# Patient Record
Sex: Female | Born: 1990 | Race: Black or African American | Hispanic: No | Marital: Single | Smoking: Never smoker
Health system: Southern US, Community
[De-identification: ages and names within clinical notes are randomized; demographics above are authoritative.]

## PROBLEM LIST (undated history)

## (undated) HISTORY — PX: TONSILLECTOMY: SUR1361

---

## 2011-11-20 ENCOUNTER — Other Ambulatory Visit (HOSPITAL_COMMUNITY)
Admission: RE | Admit: 2011-11-20 | Discharge: 2011-11-20 | Disposition: A | Discharge status: Home / Self Care | Payer: PRIVATE HEALTH INSURANCE | Source: Ambulatory Visit | Attending: Obstetrics and Gynecology | Admitting: Obstetrics and Gynecology

## 2011-11-20 DIAGNOSIS — Z113 Encounter for screening for infections with a predominantly sexual mode of transmission: Secondary | ICD-10-CM | POA: Insufficient documentation

## 2011-11-20 DIAGNOSIS — Z124 Encounter for screening for malignant neoplasm of cervix: Secondary | ICD-10-CM | POA: Insufficient documentation

## 2012-04-01 LAB — OB RESULTS CONSOLE GC/CHLAMYDIA
Chlamydia: NEGATIVE
Gonorrhea: NEGATIVE

## 2012-04-01 LAB — OB RESULTS CONSOLE RPR: RPR: NONREACTIVE

## 2012-04-01 LAB — OB RESULTS CONSOLE HEPATITIS B SURFACE ANTIGEN: Hepatitis B Surface Ag: NEGATIVE

## 2012-04-01 LAB — OB RESULTS CONSOLE ANTIBODY SCREEN: Antibody Screen: NEGATIVE

## 2012-07-07 ENCOUNTER — Inpatient Hospital Stay (HOSPITAL_COMMUNITY)
Admission: AD | Admit: 2012-07-07 | Discharge: 2012-07-09 | DRG: 775 | Disposition: A | Discharge status: Home / Self Care | Payer: Medicaid Other | Source: Ambulatory Visit | Attending: Obstetrics & Gynecology | Admitting: Obstetrics & Gynecology

## 2012-07-07 ENCOUNTER — Encounter (HOSPITAL_COMMUNITY): Payer: Self-pay | Admitting: *Deleted

## 2012-07-07 DIAGNOSIS — IMO0001 Reserved for inherently not codable concepts without codable children: Secondary | ICD-10-CM

## 2012-07-07 NOTE — MAU Note (Signed)
Pt G1 at 40.2wks having contractions every and leaking clear fluid x .  Denies problems in pregnancy.  GBS neg.

## 2012-07-08 ENCOUNTER — Encounter (HOSPITAL_COMMUNITY): Payer: Self-pay

## 2012-07-08 ENCOUNTER — Encounter (HOSPITAL_COMMUNITY): Payer: Self-pay | Admitting: Anesthesiology

## 2012-07-08 ENCOUNTER — Encounter (HOSPITAL_COMMUNITY): Payer: Self-pay | Admitting: *Deleted

## 2012-07-08 ENCOUNTER — Inpatient Hospital Stay (HOSPITAL_COMMUNITY): Payer: Medicaid Other | Admitting: Anesthesiology

## 2012-07-08 DIAGNOSIS — IMO0001 Reserved for inherently not codable concepts without codable children: Secondary | ICD-10-CM

## 2012-07-08 LAB — OB RESULTS CONSOLE GBS: GBS: NEGATIVE

## 2012-07-08 LAB — CBC
HCT: 34.9 % — ABNORMAL LOW (ref 36.0–46.0)
Hemoglobin: 11.3 g/dL — ABNORMAL LOW (ref 12.0–15.0)
MCH: 28.4 pg (ref 26.0–34.0)
MCHC: 32.4 g/dL (ref 30.0–36.0)
MCV: 87.7 fL (ref 78.0–100.0)

## 2012-07-08 MED ORDER — PHENYLEPHRINE 40 MCG/ML (10ML) SYRINGE FOR IV PUSH (FOR BLOOD PRESSURE SUPPORT): 80.0000 ug | PREFILLED_SYRINGE | INTRAVENOUS | Status: DC | PRN

## 2012-07-08 MED ORDER — IBUPROFEN 600 MG PO TABS
600.0000 mg | ORAL_TABLET | Freq: Four times a day (QID) | ORAL | Status: DC
  Administered 2012-07-08 – 2012-07-09 (×3): 600 mg via ORAL
  Filled 2012-07-08 (×3): qty 1

## 2012-07-08 MED ORDER — LACTATED RINGERS IV SOLN: 500.0000 mL | Freq: Once | INTRAVENOUS | Status: DC

## 2012-07-08 MED ORDER — IBUPROFEN 600 MG PO TABS
600.0000 mg | ORAL_TABLET | Freq: Four times a day (QID) | ORAL | Status: DC | PRN
  Administered 2012-07-08: 600 mg via ORAL
  Filled 2012-07-08: qty 1

## 2012-07-08 MED ORDER — LACTATED RINGERS IV SOLN
500.0000 mL | INTRAVENOUS | Status: DC | PRN
  Administered 2012-07-08: 300 mL via INTRAVENOUS

## 2012-07-08 MED ORDER — LIDOCAINE HCL (PF) 1 % IJ SOLN
30.0000 mL | INTRAMUSCULAR | Status: DC | PRN
  Filled 2012-07-08: qty 30

## 2012-07-08 MED ORDER — FLEET ENEMA 7-19 GM/118ML RE ENEM: 1.0000 | ENEMA | RECTAL | Status: DC | PRN

## 2012-07-08 MED ORDER — ONDANSETRON HCL 4 MG/2ML IJ SOLN: 4.0000 mg | Freq: Four times a day (QID) | INTRAMUSCULAR | Status: DC | PRN

## 2012-07-08 MED ORDER — EPHEDRINE 5 MG/ML INJ: 10.0000 mg | INTRAVENOUS | Status: DC | PRN

## 2012-07-08 MED ORDER — DIPHENHYDRAMINE HCL 25 MG PO CAPS: 25.0000 mg | ORAL_CAPSULE | Freq: Four times a day (QID) | ORAL | Status: DC | PRN

## 2012-07-08 MED ORDER — ONDANSETRON HCL 4 MG/2ML IJ SOLN: 4.0000 mg | INTRAMUSCULAR | Status: DC | PRN

## 2012-07-08 MED ORDER — TETANUS-DIPHTH-ACELL PERTUSSIS 5-2.5-18.5 LF-MCG/0.5 IM SUSP
0.5000 mL | Freq: Once | INTRAMUSCULAR | Status: AC
  Administered 2012-07-09: 0.5 mL via INTRAMUSCULAR
  Filled 2012-07-08: qty 0.5

## 2012-07-08 MED ORDER — OXYCODONE-ACETAMINOPHEN 5-325 MG PO TABS: 1.0000 | ORAL_TABLET | ORAL | Status: DC | PRN

## 2012-07-08 MED ORDER — OXYTOCIN 40 UNITS IN LACTATED RINGERS INFUSION - SIMPLE MED
62.5000 mL/h | Freq: Once | INTRAVENOUS | Status: DC
  Filled 2012-07-08: qty 1000

## 2012-07-08 MED ORDER — SIMETHICONE 80 MG PO CHEW: 80.0000 mg | CHEWABLE_TABLET | ORAL | Status: DC | PRN

## 2012-07-08 MED ORDER — WITCH HAZEL-GLYCERIN EX PADS: 1.0000 "application " | MEDICATED_PAD | CUTANEOUS | Status: DC | PRN

## 2012-07-08 MED ORDER — PHENYLEPHRINE 40 MCG/ML (10ML) SYRINGE FOR IV PUSH (FOR BLOOD PRESSURE SUPPORT)
80.0000 ug | PREFILLED_SYRINGE | INTRAVENOUS | Status: DC | PRN
  Filled 2012-07-08: qty 5

## 2012-07-08 MED ORDER — DIPHENHYDRAMINE HCL 50 MG/ML IJ SOLN: 12.5000 mg | INTRAMUSCULAR | Status: DC | PRN

## 2012-07-08 MED ORDER — ONDANSETRON HCL 4 MG PO TABS: 4.0000 mg | ORAL_TABLET | ORAL | Status: DC | PRN

## 2012-07-08 MED ORDER — CITRIC ACID-SODIUM CITRATE 334-500 MG/5ML PO SOLN: 30.0000 mL | ORAL | Status: DC | PRN

## 2012-07-08 MED ORDER — LIDOCAINE HCL (PF) 1 % IJ SOLN
INTRAMUSCULAR | Status: DC | PRN
  Administered 2012-07-08: 5 mL
  Administered 2012-07-08: 4 mL

## 2012-07-08 MED ORDER — LANOLIN HYDROUS EX OINT: TOPICAL_OINTMENT | CUTANEOUS | Status: DC | PRN

## 2012-07-08 MED ORDER — ZOLPIDEM TARTRATE 5 MG PO TABS: 5.0000 mg | ORAL_TABLET | Freq: Every evening | ORAL | Status: DC | PRN

## 2012-07-08 MED ORDER — FENTANYL 2.5 MCG/ML BUPIVACAINE 1/10 % EPIDURAL INFUSION (WH - ANES)
INTRAMUSCULAR | Status: DC | PRN
  Administered 2012-07-08: 15 mL/h via EPIDURAL

## 2012-07-08 MED ORDER — LACTATED RINGERS IV SOLN: INTRAVENOUS | Status: DC

## 2012-07-08 MED ORDER — ACETAMINOPHEN 325 MG PO TABS: 650.0000 mg | ORAL_TABLET | ORAL | Status: DC | PRN

## 2012-07-08 MED ORDER — BENZOCAINE-MENTHOL 20-0.5 % EX AERO: 1.0000 "application " | INHALATION_SPRAY | CUTANEOUS | Status: DC | PRN

## 2012-07-08 MED ORDER — EPHEDRINE 5 MG/ML INJ
10.0000 mg | INTRAVENOUS | Status: DC | PRN
  Filled 2012-07-08: qty 4

## 2012-07-08 MED ORDER — SENNOSIDES-DOCUSATE SODIUM 8.6-50 MG PO TABS
2.0000 | ORAL_TABLET | Freq: Every day | ORAL | Status: DC
  Administered 2012-07-08: 2 via ORAL

## 2012-07-08 MED ORDER — OXYTOCIN BOLUS FROM INFUSION
250.0000 mL | Freq: Once | INTRAVENOUS | Status: AC
  Administered 2012-07-08: 250 mL via INTRAVENOUS
  Filled 2012-07-08: qty 500

## 2012-07-08 MED ORDER — FENTANYL 2.5 MCG/ML BUPIVACAINE 1/10 % EPIDURAL INFUSION (WH - ANES)
14.0000 mL/h | INTRAMUSCULAR | Status: DC
  Administered 2012-07-08: 14 mL/h via EPIDURAL
  Filled 2012-07-08 (×2): qty 60

## 2012-07-08 MED ORDER — BUTORPHANOL TARTRATE 1 MG/ML IJ SOLN: 1.0000 mg | INTRAMUSCULAR | Status: DC | PRN

## 2012-07-08 MED ORDER — PRENATAL MULTIVITAMIN CH
1.0000 | ORAL_TABLET | Freq: Every day | ORAL | Status: DC
  Administered 2012-07-09: 1 via ORAL
  Filled 2012-07-08: qty 1

## 2012-07-08 MED ORDER — DIBUCAINE 1 % RE OINT: 1.0000 "application " | TOPICAL_OINTMENT | RECTAL | Status: DC | PRN

## 2012-07-08 NOTE — H&P (Signed)
Latoya Bowen is a 21 y.o. female presenting for c/o rom at 11 pm on 07/07/2012.  She is 40 wks and 3 days with EDD 07/05/2012.  Maternal Medical History:  Reason for admission: Reason for admission: rupture of membranes.  Contractions: Onset was 6-12 hours ago.   Frequency: regular.   Duration is approximately 7 minutes.   Perceived severity is moderate.    Fetal activity: Perceived fetal activity is normal.   Last perceived fetal movement was within the past hour.    Prenatal complications: no prenatal complications   OB History    Grav Para Term Preterm Abortions TAB SAB Ect Mult Living   2    1 1     0     History reviewed. No pertinent past medical history. Past Surgical History  Procedure Date  . Tonsillectomy    Family History: family history is not on file. Social History:  reports that she has never smoked. She does not have any smokeless tobacco history on file. She reports that she does not drink alcohol or use illicit drugs.   Prenatal Transfer Tool  Maternal Diabetes: No Genetic Screening: Normal Maternal Ultrasounds/Referrals: Normal Fetal Ultrasounds or other Referrals:  None Maternal Substance Abuse:  No Significant Maternal Medications:  None Significant Maternal Lab Results:  Lab values include: Group B Strep negative Other Comments:  None  Review of Systems  All other systems reviewed and are negative.    Dilation: 6 Effacement (%): 100 Station: -1 Exam by:: Dr. Gerald Leitz Blood pressure 121/72, pulse 74, temperature 98.2 F (36.8 C), temperature source Oral, resp. rate 20, height 5\' 7"  (1.702 m), weight 84.823 kg (187 lb), SpO2 99.00%. Maternal Exam:  Uterine Assessment: Contraction strength is moderate.  Contraction duration is 1 minute. Contraction frequency is regular.   Abdomen: Patient reports no abdominal tenderness. Estimated fetal weight is 7 lbs 6 oz .   Fetal presentation: vertex  Introitus: Normal vulva. Normal vagina.    Fetal  Exam Fetal Monitor Review: Mode: fetoscope.   Baseline rate: 140.  Variability: moderate (6-25 bpm).   Pattern: accelerations present and no decelerations.    Fetal State Assessment: Category I - tracings are normal.     Physical Exam  Vitals reviewed. Constitutional: She is oriented to person, place, and time. She appears well-developed.  Cardiovascular: Normal rate and regular rhythm.   Respiratory: Effort normal and breath sounds normal.  GI: Bowel sounds are normal.  Genitourinary: Vagina normal.  Musculoskeletal: Normal range of motion.  Neurological: She is alert and oriented to person, place, and time.   Extremities: no edema  Prenatal labs: ABO, Rh: A/Positive/-- (05/23 0000) Antibody: Negative (05/23 0000) Rubella: Equivocal (05/23 0000) RPR: NON REACTIVE (08/29 0100)  HBsAg: Negative (05/23 0000)  HIV: Non-reactive (05/23 0000)  GBS: Negative (08/29 0000)   Assessment/Plan: 40 wks and 3 days in active labor gbs negative..  Anticipate SVD    Latoya Hatchel J. 07/08/2012, 5:56 AM

## 2012-07-08 NOTE — Op Note (Signed)
Delivery Note At 10:27 AM a viable and healthy female was delivered via Vaginal, Spontaneous Delivery (Presentation: ; Occiput Posterior and rotated at plus 8 station to  doa).  APGAR: 7, 9; weight .   Placenta status: Intact, Spontaneous.  Cord: 3 vessels with the following complications: None.  Cord pH: 7.18  Anesthesia: Epidural  Episiotomy: None Lacerations: None Suture Repair: none Est. Blood Loss (mL): 200  Mom to none.  Baby to nursery-stable.  Hollis Tuller H. 07/08/2012, 11:42 AM

## 2012-07-08 NOTE — Anesthesia Postprocedure Evaluation (Signed)
  Anesthesia Post-op Note  Patient: Latoya Bowen  Procedure(s) Performed: * No procedures listed *  Patient Location: Mother/Baby  Anesthesia Type: Epidural  Level of Consciousness: awake  Airway and Oxygen Therapy: Patient Spontanous Breathing  Post-op Pain: none  Post-op Assessment: Patient's Cardiovascular Status Stable, Respiratory Function Stable, Patent Airway, No signs of Nausea or vomiting, Adequate PO intake, Pain level controlled, No headache, No backache, No residual numbness and No residual motor weakness  Post-op Vital Signs: Reviewed and stable  Complications: No apparent anesthesia complications

## 2012-07-08 NOTE — Anesthesia Preprocedure Evaluation (Signed)

## 2012-07-08 NOTE — Anesthesia Procedure Notes (Signed)
Epidural Patient location during procedure: OB Start time: 07/08/2012 3:17 AM  Staffing Anesthesiologist: Willford Rabideau A. Performed by: anesthesiologist   Preanesthetic Checklist Completed: patient identified, site marked, surgical consent, pre-op evaluation, timeout performed, IV checked, risks and benefits discussed and monitors and equipment checked  Epidural Patient position: sitting Prep: site prepped and draped and DuraPrep Patient monitoring: continuous pulse ox and blood pressure Approach: midline Injection technique: LOR air  Needle:  Needle type: Tuohy  Needle gauge: 17 G Needle length: 9 cm and 9 Needle insertion depth: 4 and 4 cm Catheter type: closed end flexible Catheter size: 19 Gauge Catheter at skin depth: 9 cm Test dose: negative and Other  Assessment Events: blood not aspirated, injection not painful, no injection resistance, negative IV test and no paresthesia  Additional Notes Patient identified. Risks and benefits discussed including failed block, incomplete  Pain control, post dural puncture headache, nerve damage, paralysis, blood pressure Changes, nausea, vomiting, reactions to medications-both toxic and allergic and post Partum back pain. All questions were answered. Patient expressed understanding and wished to proceed. Sterile technique was used throughout procedure. Epidural site was Dressed with sterile barrier dressing. No paresthesias, signs of intravascular injection Or signs of intrathecal spread were encountered.  Patient was more comfortable after the epidural was dosed. Please see RN's note for documentation of vital signs and FHR which are stable.

## 2012-07-09 LAB — CBC
MCV: 87.8 fL (ref 78.0–100.0)
Platelets: 195 10*3/uL (ref 150–400)
RDW: 12.3 % (ref 11.5–15.5)
WBC: 8.1 10*3/uL (ref 4.0–10.5)

## 2012-07-09 NOTE — Progress Notes (Signed)
UR chart review completed.  

## 2012-07-09 NOTE — Progress Notes (Signed)
Post Partum Day 2Subjective: no complaints  Objective: Blood pressure 117/65, pulse 71, temperature 97.8 F (36.6 C), temperature source Oral, resp. rate 16, height 5\' 7"  (1.702 m), weight 187 lb (84.823 kg), SpO2 99.00%, unknown if currently breastfeeding.  Physical Exam:  General: alert, cooperative and no distress Lochia: appropriate Uterine Fundus: firm Episiotomy, laceration : no significant drainage DVT Evaluation: No evidence of DVT seen on physical exam.   Basename 07/09/12 0535 07/08/12 0100  HGB 10.4* 11.3*  HCT 31.6* 34.9*    Assessment/Plan: Discharge home   LOS: 2 days   Kao Conry E 07/09/2012, 9:33 AM

## 2012-07-09 NOTE — Discharge Summary (Signed)
Obstetric Discharge Summary Reason for Admission: onset of labor Prenatal Procedures: none Intrapartum Procedures: spontaneous vaginal delivery Postpartum Procedures: none Complications-Operative and Postpartum: none  Hemoglobin  Date Value Range Status  07/09/2012 10.4* 12.0 - 15.0 g/dL Final     HCT  Date Value Range Status  07/09/2012 31.6* 36.0 - 46.0 % Final    Discharge Diagnoses: Term Pregnancy-delivered  Discharge Information: Date: 07/09/2012 Activity: pelvic rest Diet: routine Medications: Ibuprofen Condition: stable Instructions: refer to practice specific booklet Discharge to: home   Newborn Data: Live born  Information for the patient's newborn:  Aarti, Mankowski [[id number]]  female ; APGAR , ; weight ;  Home with mother.  Laraine Samet E 07/09/2012, 9:34 AM

## 2014-09-11 ENCOUNTER — Encounter (HOSPITAL_COMMUNITY): Payer: Self-pay

## 2018-07-04 ENCOUNTER — Encounter (HOSPITAL_BASED_OUTPATIENT_CLINIC_OR_DEPARTMENT_OTHER): Payer: Self-pay | Admitting: Emergency Medicine

## 2018-07-04 ENCOUNTER — Emergency Department (HOSPITAL_BASED_OUTPATIENT_CLINIC_OR_DEPARTMENT_OTHER)
Admission: EM | Admit: 2018-07-04 | Discharge: 2018-07-04 | Disposition: A | Discharge status: Home / Self Care | Payer: BLUE CROSS/BLUE SHIELD | Attending: Emergency Medicine | Admitting: Emergency Medicine

## 2018-07-04 ENCOUNTER — Other Ambulatory Visit: Payer: Self-pay

## 2018-07-04 DIAGNOSIS — N939 Abnormal uterine and vaginal bleeding, unspecified: Secondary | ICD-10-CM | POA: Diagnosis present

## 2018-07-04 LAB — COMPREHENSIVE METABOLIC PANEL
ALK PHOS: 77 U/L (ref 38–126)
ALT: 14 U/L (ref 0–44)
ANION GAP: 9 (ref 5–15)
AST: 20 U/L (ref 15–41)
Albumin: 4.1 g/dL (ref 3.5–5.0)
BUN: 9 mg/dL (ref 6–20)
CALCIUM: 9.2 mg/dL (ref 8.9–10.3)
CO2: 28 mmol/L (ref 22–32)
CREATININE: 1.04 mg/dL — AB (ref 0.44–1.00)
Chloride: 100 mmol/L (ref 98–111)
GFR calc non Af Amer: >60 mL/min (ref >60)
Glucose, Bld: 96 mg/dL (ref 70–99)
Potassium: 3.4 mmol/L — ABNORMAL LOW (ref 3.5–5.1)
Sodium: 137 mmol/L (ref 135–145)
TOTAL PROTEIN: 8.4 g/dL — AB (ref 6.5–8.1)
Total Bilirubin: 0.7 mg/dL (ref 0.3–1.2)

## 2018-07-04 LAB — CBC WITH DIFFERENTIAL/PLATELET
Basophils Absolute: 0 10*3/uL (ref 0.0–0.1)
Basophils Relative: 1 %
EOS ABS: 0 10*3/uL (ref 0.0–0.7)
Eosinophils Relative: 1 %
HEMATOCRIT: 37 % (ref 36.0–46.0)
HEMOGLOBIN: 12.1 g/dL (ref 12.0–15.0)
Lymphocytes Relative: 25 %
Lymphs Abs: 1.4 10*3/uL (ref 0.7–4.0)
MCH: 28.2 pg (ref 26.0–34.0)
MCHC: 32.7 g/dL (ref 30.0–36.0)
MCV: 86.2 fL (ref 78.0–100.0)
MONOS PCT: 8 %
Monocytes Absolute: 0.5 10*3/uL (ref 0.1–1.0)
NEUTROS ABS: 3.7 10*3/uL (ref 1.7–7.7)
NEUTROS PCT: 65 %
Platelets: 391 10*3/uL (ref 150–400)
RBC: 4.29 MIL/uL (ref 3.87–5.11)
RDW: 11.8 % (ref 11.5–15.5)
WBC: 5.6 10*3/uL (ref 4.0–10.5)

## 2018-07-04 LAB — PREGNANCY, URINE: Preg Test, Ur: NEGATIVE

## 2018-07-04 LAB — WET PREP, GENITAL
Sperm: NONE SEEN
TRICH WET PREP: NONE SEEN
Yeast Wet Prep HPF POC: NONE SEEN

## 2018-07-04 MED ORDER — NORGESTIMATE-ETH ESTRADIOL 0.25-35 MG-MCG PO TABS: 1.0000 | ORAL_TABLET | Freq: Every day | ORAL | 0 refills | Status: DC

## 2018-07-04 MED ORDER — NORGESTIMATE-ETH ESTRADIOL 0.25-35 MG-MCG PO TABS: 1.0000 | ORAL_TABLET | Freq: Every day | ORAL | 11 refills | Status: DC

## 2018-07-04 NOTE — ED Triage Notes (Signed)
Vaginal bleeding x 1 week. States she doesn't normally have a period this long. States she is passing clots. She states she took 3 birth control pills at one time just before the bleeding started to try to stop her period for her vacation.

## 2018-07-04 NOTE — ED Provider Notes (Signed)
Emergency Department Provider Note   I have reviewed the triage vital signs and the nursing notes.   HISTORY  Chief Complaint Vaginal Bleeding   HPI Latoya Bowen is a 27 y.o. female without significant past medical history the presents to the emergency department today secondary to vaginal bleeding.  Patient states that last month she took some of her friend's birth control pill to try to stop her.  From coming on because she is going on vacation.  She took one birth control pill on Sunday Monday and Tuesday skipped Wednesday and took one on Thursday but then her period started on Saturday.  That period was normal and lasted about a week which would be her normal.  It was a little bit earlier than she expected.  7 days ago patient started having vaginal bleeding again and is a bit heavier than what she is used to and is significant earlier as well.  This is continued without improvement so she presents here for further evaluation.  She has abdominal cramping but no severe abdominal pain.  On multiple attempts at review of systems it does like she has some pain with intercourse but only during one episode and since then she is been fine.  She has not had any vaginal discharge.  She did have some right shoulder pain in the next day had some right upper quadrant pain.  She has no history of STDs and reports monogamy with her boyfriend with whom she has low suspicion for him being unfaithful. No other associated or modifying symptoms.    History reviewed. No pertinent past medical history.  Patient Active Problem List   Diagnosis Date Noted  . Active labor 07/08/2012    Past Surgical History:  Procedure Laterality Date  . TONSILLECTOMY      Current Outpatient Rx  . Order #: [id number] Class: Historical Med  . Order #: [id number] Class: Print  . Order #: [id number] Class: Print    Allergies Patient has no known allergies.  No family history on file.  Social History Social History    Tobacco Use  . Smoking status: Never Smoker  . Smokeless tobacco: Never Used  Substance Use Topics  . Alcohol use: No  . Drug use: No    Review of Systems  All other systems negative except as documented in the HPI. All pertinent positives and negatives as reviewed in the HPI. ____________________________________________   PHYSICAL EXAM:  VITAL SIGNS: Vitals not charted. Had BP of 108/?, HR of 68.  Constitutional: Alert and oriented. Well appearing and in no acute distress. Eyes: Conjunctivae are normal. PERRL. EOMI. Head: Atraumatic. Nose: No congestion/rhinnorhea. Mouth/Throat: Mucous membranes are moist.  Oropharynx non-erythematous. Neck: No stridor.  No meningeal signs.   Cardiovascular: Normal rate, regular rhythm. Good peripheral circulation. Grossly normal heart sounds.   Respiratory: Normal respiratory effort.  No retractions. Lungs CTAB. Gastrointestinal: Soft and nontender. No distention.  GU: chaperoned by RT, Crystal, blood in vault, oozing from closed cervical os. No CMT. No adnexal fullness or ttp. Musculoskeletal: No lower extremity tenderness nor edema. No gross deformities of extremities. Neurologic:  Normal speech and language. No gross focal neurologic deficits are appreciated.  Skin:  Skin is warm, dry and intact. No rash noted.   ____________________________________________   LABS (all labs ordered are listed, but only abnormal results are displayed)  Labs Reviewed  WET PREP, GENITAL - Abnormal; Notable for the following components:      Result Value   Clue Cells Wet Prep HPF  POC PRESENT (*)    WBC, Wet Prep HPF POC FEW (*)    All other components within normal limits  COMPREHENSIVE METABOLIC PANEL - Abnormal; Notable for the following components:   Potassium 3.4 (*)    Creatinine, Ser 1.04 (*)    Total Protein 8.4 (*)    All other components within normal limits  PREGNANCY, URINE  CBC WITH DIFFERENTIAL/PLATELET  GC/CHLAMYDIA PROBE AMP  ([place]) NOT AT [facility]   ____________________________________________  PROCEDURES  Procedure(s) performed:   Pelvic exam Date/Time: 07/04/2018 2:39 PM Performed by: Marily MemosMesner, Kamilla Hands, MD Authorized by: Marily MemosMesner, Luwanna Brossman, MD  Consent: Verbal consent obtained. Risks and benefits: risks, benefits and alternatives were discussed Consent given by: patient Patient understanding: patient states understanding of the procedure being performed Patient consent: the patient's understanding of the procedure does not match consent given Required items: required blood products, implants, devices, and special equipment available Patient identity confirmed: verbally with patient Time out: Immediately prior to procedure a "time out" was called to verify the correct patient, procedure, equipment, support staff and site/side marked as required. Preparation: Patient was prepped and draped in the usual sterile fashion. Local anesthesia used: no  Anesthesia: Local anesthesia used: no  Sedation: Patient sedated: no  Patient tolerance: Patient tolerated the procedure well with no immediate complications Comments: Chaperoned by RT, Crystal.   ____________________________________________   INITIAL IMPRESSION / ASSESSMENT AND PLAN / ED COURSE  Abnormal uterine bleed likely secondary to introduction of exogenous hormones and abnormal pattern.  No evidence of pregnancy, STD or other abdomen allergies on exam.  No urinary symptoms to suspect urinary tract infection.  Has had normal Pap smears and a normal GU exam making cancer unlikely.  Will institute hormonal therapy and gynecology follow-up.   Pertinent labs & imaging results that were available during my care of the patient were reviewed by me and considered in my medical decision making (see chart for details).  ____________________________________________  FINAL CLINICAL IMPRESSION(S) / ED DIAGNOSES  Final diagnoses:  Vaginal bleeding     MEDICATIONS GIVEN DURING THIS VISIT:  Medications - No data to display   NEW OUTPATIENT MEDICATIONS STARTED DURING THIS VISIT:  Current Discharge Medication List    START taking these medications   Details  !! norgestimate-ethinyl estradiol (SPRINTEC 28) 0.25-35 MG-MCG tablet Take 1 tablet by mouth daily. Qty: 1 Package, Refills: 0    !! norgestimate-ethinyl estradiol (SPRINTEC 28) 0.25-35 MG-MCG tablet Take 1 tablet by mouth daily. Qty: 1 Package, Refills: 11     !! - Potential duplicate medications found. Please discuss with provider.      Note:  This note was prepared with assistance of Dragon voice recognition software. Occasional wrong-word or sound-a-like substitutions may have occurred due to the inherent limitations of voice recognition software.   Marily MemosMesner, Katelyn Kohlmeyer, MD 07/04/18 [phone]

## 2018-07-04 NOTE — ED Notes (Signed)
Pt unable to void at this time. 

## 2018-07-04 NOTE — ED Notes (Signed)
Pt/family verbalized understanding of discharge instructions.   

## 2018-07-04 NOTE — ED Notes (Signed)
Pt asked for this EMT not to come help the Dr with the pelvic due to personally knowing the pt and stating " it will just be too awkward for you to be in here and seeing my parts." EMT understood and got someone else to help with the pelvic.

## 2018-07-04 NOTE — Discharge Instructions (Addendum)
You have received 2 prescriptions for a medication called Sprintec. This is an oral contraceptive. The first prescription is for one package. This one package you should take 3 pills a day for one week then throw the rest away. This should help with your uterine bleeding. It should cease during this time. After this week take one week off and during this time you will likely have a regular period. After the one week off without medication get the other prescription filled which is the same medication. Then start taking it once daily as instructed on the package. This may cause a little bit of nausea when you're taking 3 a day if so you may back down to twice a day. Please follow-up with her gynecologist in 1-2 weeks for further evaluation and management of your vaginal bleeding.

## 2018-07-05 LAB — GC/CHLAMYDIA PROBE AMP (~~LOC~~) NOT AT ARMC
Chlamydia: POSITIVE — AB
NEISSERIA GONORRHEA: NEGATIVE

## 2018-07-08 ENCOUNTER — Telehealth: Payer: Self-pay | Admitting: Student

## 2018-07-08 DIAGNOSIS — A749 Chlamydial infection, unspecified: Secondary | ICD-10-CM

## 2018-07-08 MED ORDER — AZITHROMYCIN 500 MG PO TABS: 1000.0000 mg | ORAL_TABLET | Freq: Once | ORAL | 0 refills | Status: AC

## 2018-07-08 NOTE — Telephone Encounter (Signed)
-----   Message from Kathe BectonLori S [name], RN sent at 07/08/2018  3:09 PM EDT ----- This patient tested positive for:  chlamydia  She has, "NKDA",  I have informed the patient of her results and confirmed her pharmacy is correct in her chart. Please send Rx.   Thank you,   [name], [name], RN   [facility]

## 2018-07-08 NOTE — Telephone Encounter (Addendum)
  Latoya Bowen tested positive for  Chlamydia. Patient was called by RN and allergies and pharmacy confirmed. Rx sent to pharmacy of choice.   [name], [name], NP 07/08/2018 4:40 PM       ----- Message from Kathe BectonLori S [name], RN sent at 07/08/2018  3:09 PM EDT ----- This patient tested positive for:  chlamydia  She has, "NKDA",  I have informed the patient of her results and confirmed her pharmacy is correct in her chart. Please send Rx.   Thank you,   [name], [name], RN   [facility]

## 2018-08-05 ENCOUNTER — Emergency Department (HOSPITAL_BASED_OUTPATIENT_CLINIC_OR_DEPARTMENT_OTHER)
Admission: EM | Admit: 2018-08-05 | Discharge: 2018-08-05 | Disposition: A | Discharge status: Home / Self Care | Payer: BLUE CROSS/BLUE SHIELD | Attending: Emergency Medicine | Admitting: Emergency Medicine

## 2018-08-05 ENCOUNTER — Other Ambulatory Visit: Payer: Self-pay

## 2018-08-05 ENCOUNTER — Encounter (HOSPITAL_BASED_OUTPATIENT_CLINIC_OR_DEPARTMENT_OTHER): Payer: Self-pay | Admitting: Emergency Medicine

## 2018-08-05 DIAGNOSIS — H00015 Hordeolum externum left lower eyelid: Secondary | ICD-10-CM

## 2018-08-05 MED ORDER — POLYMYXIN B-TRIMETHOPRIM 10000-0.1 UNIT/ML-% OP SOLN: 1.0000 [drp] | OPHTHALMIC | 0 refills | Status: AC

## 2018-08-05 NOTE — ED Triage Notes (Signed)
Redness and swelling to lower left eyelid x3 days.  Sts it started draining yesterday.

## 2018-08-05 NOTE — ED Provider Notes (Signed)
[facility] EMERGENCY DEPARTMENT Provider Note   CSN: [id number] Arrival date & time: 08/05/18  4098     History   Chief Complaint Chief Complaint  Patient presents with  . Eye Problem    HPI Latoya Bowen is a 27 y.o. female.  HPI   27 year old female with redness, swelling and discomfort to left lower eyelid.  Onset about 3 days ago.  Persistent since then.  She has been using warm compresses without improvement.  No change in her vision.  No fevers or chills.  No drainage.  History reviewed. No pertinent past medical history.  Patient Active Problem List   Diagnosis Date Noted  . Active labor 07/08/2012    Past Surgical History:  Procedure Laterality Date  . TONSILLECTOMY       OB History    Gravida  2   Para  1   Term  1   Preterm      AB  1   Living  1     SAB      TAB  1   Ectopic      Multiple      Live Births  1            Home Medications    Prior to Admission medications   Medication Sig Start Date End Date Taking? Authorizing Provider  Fluocin-Hydroquinone-Tretinoin (TRI-LUMA) 0.01-4-0.05 % CREA AFFECTED AREA(S) AT BEDTIME ONCE DAILY 01/11/18  Yes [provider]  Multiple Vitamin (MULTIVITAMIN) tablet Take 1 tablet by mouth daily.    [provider]  trimethoprim-polymyxin b (POLYTRIM) ophthalmic solution Place 1 drop into the left eye every 4 (four) hours. 08/05/18   Raeford Razor, MD    Family History No family history on file.  Social History Social History   Tobacco Use  . Smoking status: Never Smoker  . Smokeless tobacco: Never Used  Substance Use Topics  . Alcohol use: No  . Drug use: No     Allergies   Patient has no known allergies.   Review of Systems Review of Systems  All systems reviewed and negative, other than as noted in HPI.  Physical Exam Updated Vital Signs BP 135/89   Pulse 74   Temp 98.3 F (36.8 C) (Oral)   Resp 16   Ht 5\' 8"  (1.727 m)   Wt 77.1 kg    LMP 08/01/2018   SpO2 99%   BMI 25.85 kg/m   Physical Exam  Constitutional: She appears well-developed and well-nourished. No distress.  HENT:  Head: Normocephalic and atraumatic.  Eyes: Right eye exhibits no discharge. Left eye exhibits no discharge.  Hordeolum of left lower eyelid.  Lids, lashes and conjunctiva otherwise normal.  No drainage.  Neck: Neck supple.  Cardiovascular: Normal rate, regular rhythm and normal heart sounds. Exam reveals no gallop and no friction rub.  No murmur heard. Pulmonary/Chest: Effort normal and breath sounds normal. No respiratory distress.  Abdominal: Soft. She exhibits no distension. There is no tenderness.  Musculoskeletal: She exhibits no edema or tenderness.  Neurological: She is alert.  Skin: Skin is warm and dry.  Psychiatric: She has a normal mood and affect. Her behavior is normal. Thought content normal.  Nursing note and vitals reviewed.    ED Treatments / Results  Labs (all labs ordered are listed, but only abnormal results are displayed) Labs Reviewed - No data to display  EKG None  Radiology No results found.  Procedures Procedures (including critical care time)  Medications  Ordered in ED Medications - No data to display   Initial Impression / Assessment and Plan / ED Course  I have reviewed the triage vital signs and the nursing notes.  Pertinent labs & imaging results that were available during my care of the patient were reviewed by me and considered in my medical decision making (see chart for details).     27 year old female with hordeolum of left lower eyelid.  Continue warm compresses. Antibiotic drops.  Advised not to wear any contact lenses until completely healed.  Emergent return precautions were discussed.  Final Clinical Impressions(s) / ED Diagnoses   Final diagnoses:  Hordeolum externum of left lower eyelid    ED Discharge Orders         Ordered    trimethoprim-polymyxin b (POLYTRIM) ophthalmic  solution  Every 4 hours    Note to Pharmacy:  For 5 days   08/05/18 1008           Raeford Razor, MD 08/05/18 1051

## 2018-08-24 ENCOUNTER — Other Ambulatory Visit: Payer: Self-pay

## 2018-08-24 ENCOUNTER — Emergency Department (HOSPITAL_BASED_OUTPATIENT_CLINIC_OR_DEPARTMENT_OTHER): Payer: 59

## 2018-08-24 ENCOUNTER — Emergency Department (HOSPITAL_BASED_OUTPATIENT_CLINIC_OR_DEPARTMENT_OTHER)
Admission: EM | Admit: 2018-08-24 | Discharge: 2018-08-24 | Disposition: A | Discharge status: Home / Self Care | Payer: 59 | Attending: Emergency Medicine | Admitting: Emergency Medicine

## 2018-08-24 ENCOUNTER — Encounter (HOSPITAL_BASED_OUTPATIENT_CLINIC_OR_DEPARTMENT_OTHER): Payer: Self-pay | Admitting: Emergency Medicine

## 2018-08-24 DIAGNOSIS — Y999 Unspecified external cause status: Secondary | ICD-10-CM | POA: Insufficient documentation

## 2018-08-24 DIAGNOSIS — Y9364 Activity, baseball: Secondary | ICD-10-CM | POA: Diagnosis not present

## 2018-08-24 DIAGNOSIS — Y92320 Baseball field as the place of occurrence of the external cause: Secondary | ICD-10-CM | POA: Insufficient documentation

## 2018-08-24 DIAGNOSIS — Z79899 Other long term (current) drug therapy: Secondary | ICD-10-CM | POA: Insufficient documentation

## 2018-08-24 DIAGNOSIS — X509XXA Other and unspecified overexertion or strenuous movements or postures, initial encounter: Secondary | ICD-10-CM | POA: Diagnosis not present

## 2018-08-24 DIAGNOSIS — S8391XA Sprain of unspecified site of right knee, initial encounter: Secondary | ICD-10-CM | POA: Insufficient documentation

## 2018-08-24 DIAGNOSIS — S93402A Sprain of unspecified ligament of left ankle, initial encounter: Secondary | ICD-10-CM | POA: Insufficient documentation

## 2018-08-24 DIAGNOSIS — S99912A Unspecified injury of left ankle, initial encounter: Secondary | ICD-10-CM | POA: Diagnosis present

## 2018-08-24 MED ORDER — IBUPROFEN 800 MG PO TABS: 800.0000 mg | ORAL_TABLET | Freq: Three times a day (TID) | ORAL | 0 refills | Status: DC | PRN
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
## 2018-08-24 NOTE — Discharge Instructions (Signed)
Return here as needed.  Ice and elevate the knee and ankle.  Follow-up with the orthopedist provided.

## 2018-08-24 NOTE — ED Triage Notes (Signed)
Pt states she twisted her left ankle and hit her left knee. Pt having 5/10 pain.

## 2019-11-24 ENCOUNTER — Emergency Department (HOSPITAL_BASED_OUTPATIENT_CLINIC_OR_DEPARTMENT_OTHER)
Admission: EM | Admit: 2019-11-24 | Discharge: 2019-11-24 | Disposition: A | Discharge status: Home / Self Care | Payer: 59 | Attending: Emergency Medicine | Admitting: Emergency Medicine

## 2019-11-24 ENCOUNTER — Other Ambulatory Visit: Payer: Self-pay

## 2019-11-24 ENCOUNTER — Encounter (HOSPITAL_BASED_OUTPATIENT_CLINIC_OR_DEPARTMENT_OTHER): Payer: Self-pay | Admitting: Emergency Medicine

## 2019-11-24 DIAGNOSIS — Z79899 Other long term (current) drug therapy: Secondary | ICD-10-CM | POA: Diagnosis not present

## 2019-11-24 DIAGNOSIS — H5789 Other specified disorders of eye and adnexa: Secondary | ICD-10-CM | POA: Diagnosis present

## 2019-11-24 DIAGNOSIS — H1032 Unspecified acute conjunctivitis, left eye: Secondary | ICD-10-CM | POA: Insufficient documentation

## 2019-11-24 MED ORDER — GENTAMICIN SULFATE 0.3 % OP SOLN: 2.0000 [drp] | Freq: Four times a day (QID) | OPHTHALMIC | 0 refills | Status: AC

## 2019-11-24 NOTE — ED Triage Notes (Signed)
Pt felt like something got into her eye a week before christmas.  It became swollen and she applied warm compresses which allowed fluid to drain.  Pt continues to feel like there is a foreign body in her left eye.  Noted some redness in corner of left eye.

## 2019-11-24 NOTE — ED Triage Notes (Signed)
Denies blurry vision but does have some watering occasionally.

## 2019-11-24 NOTE — ED Provider Notes (Signed)
[facility] EMERGENCY DEPARTMENT Provider Note   CSN: [id number] Arrival date & time: 11/24/19  1004     History Chief Complaint  Patient presents with  . Foreign Body in [place] is a 29 y.o. female.  Patient is a 29 year old female with no significant past medical history.  She presents with a several week history of eye irritation.  She states that she was working with her acrylic nails when a piece got in the corner of her left eye.  This was 2 days before Christmas.  Since then, she has had irritation and foreign body sensation.  She has tried warm compresses which did seem to help.  Her left eyelid continues to feel swollen and uncomfortable.  She denies any visual disturbances.  The history is provided by the patient.  Foreign Body in Eye This is a new problem. Episode onset: 3 weeks ago. The problem occurs constantly. The problem has been gradually worsening. Nothing aggravates the symptoms. Nothing relieves the symptoms.       History reviewed. No pertinent past medical history.  Patient Active Problem List   Diagnosis Date Noted  . Active labor 07/08/2012    Past Surgical History:  Procedure Laterality Date  . TONSILLECTOMY       OB History    Gravida  2   Para  1   Term  1   Preterm      AB  1   Living  1     SAB      TAB  1   Ectopic      Multiple      Live Births  1           History reviewed. No pertinent family history.  Social History   Tobacco Use  . Smoking status: Never Smoker  . Smokeless tobacco: Never Used  Substance Use Topics  . Alcohol use: No  . Drug use: No    Home Medications Prior to Admission medications   Medication Sig Start Date End Date Taking? Authorizing Provider  Fluocin-Hydroquinone-Tretinoin (TRI-LUMA) 0.01-4-0.05 % CREA AFFECTED AREA(S) AT BEDTIME ONCE DAILY 01/11/18   [provider]  ibuprofen (ADVIL,MOTRIN) 800 MG tablet Take 1 tablet (800 mg total) by mouth every  8 (eight) hours as needed. 08/24/18   Lawyer, Harrell Gave, PA-C  Multiple Vitamin (MULTIVITAMIN) tablet Take 1 tablet by mouth daily.    [provider]  trimethoprim-polymyxin b (POLYTRIM) ophthalmic solution Place 1 drop into the left eye every 4 (four) hours. 08/05/18   Virgel Manifold, MD    Allergies    Patient has no known allergies.  Review of Systems   Review of Systems  All other systems reviewed and are negative.   Physical Exam Updated Vital Signs BP (!) 116/91   Pulse 89   Temp 98.4 F (36.9 C) (Oral)   Resp 16   Ht 5\' 8"  (1.727 m)   Wt 70.3 kg   LMP 11/11/2019   SpO2 100%   BMI 23.57 kg/m   Physical Exam Vitals and nursing note reviewed.  Constitutional:      General: She is not in acute distress.    Appearance: Normal appearance.  HENT:     Head: Normocephalic and atraumatic.  Eyes:     Comments: The left eye appears grossly normal.  The cornea is clear and the anterior chamber is clear.  Pupil is reactive.  The left eyelid does have minor swelling to the lateral  aspect which may represent a stye.  The lid was everted and I am unable to identify any definitive foreign body.  Pulmonary:     Effort: Pulmonary effort is normal.  Neurological:     Mental Status: She is alert and oriented to person, place, and time.     ED Results / Procedures / Treatments   Labs (all labs ordered are listed, but only abnormal results are displayed) Labs Reviewed - No data to display  EKG None  Radiology No results found.  Procedures Procedures (including critical care time)  Medications Ordered in ED Medications - No data to display  ED Course  I have reviewed the triage vital signs and the nursing notes.  Pertinent labs & imaging results that were available during my care of the patient were reviewed by me and considered in my medical decision making (see chart for details).    MDM Rules/Calculators/A&P  Due to the ongoing irritation,  conjunctivitis is a possibility.  She will be given antibiotic drops and is to continue warm compresses.  To follow-up with ophthalmology if not improving.  Final Clinical Impression(s) / ED Diagnoses Final diagnoses:  None    Rx / DC Orders ED Discharge Orders    None       Geoffery Lyons, MD 11/24/19 1113

## 2019-11-24 NOTE — Discharge Instructions (Addendum)
Begin using gentamicin ophthalmic drops as prescribed.  Continue warm compresses several times daily.  Follow-up with ophthalmology if symptoms or not improving in the next week.

## 2021-02-15 ENCOUNTER — Emergency Department (HOSPITAL_BASED_OUTPATIENT_CLINIC_OR_DEPARTMENT_OTHER)
Admission: EM | Admit: 2021-02-15 | Discharge: 2021-02-15 | Disposition: A | Discharge status: Home / Self Care | Payer: Self-pay | Attending: Emergency Medicine | Admitting: Emergency Medicine

## 2021-02-15 ENCOUNTER — Other Ambulatory Visit: Payer: Self-pay

## 2021-02-15 ENCOUNTER — Emergency Department (HOSPITAL_BASED_OUTPATIENT_CLINIC_OR_DEPARTMENT_OTHER): Payer: Self-pay

## 2021-02-15 ENCOUNTER — Encounter (HOSPITAL_BASED_OUTPATIENT_CLINIC_OR_DEPARTMENT_OTHER): Payer: Self-pay

## 2021-02-15 DIAGNOSIS — Y92410 Unspecified street and highway as the place of occurrence of the external cause: Secondary | ICD-10-CM | POA: Insufficient documentation

## 2021-02-15 DIAGNOSIS — G5131 Clonic hemifacial spasm, right: Secondary | ICD-10-CM | POA: Insufficient documentation

## 2021-02-15 DIAGNOSIS — R519 Headache, unspecified: Secondary | ICD-10-CM | POA: Insufficient documentation

## 2021-02-15 DIAGNOSIS — R002 Palpitations: Secondary | ICD-10-CM | POA: Diagnosis not present

## 2021-02-15 DIAGNOSIS — H9201 Otalgia, right ear: Secondary | ICD-10-CM | POA: Insufficient documentation

## 2021-02-15 DIAGNOSIS — G5139 Clonic hemifacial spasm, unspecified: Secondary | ICD-10-CM

## 2021-02-15 LAB — CBC WITH DIFFERENTIAL/PLATELET
Abs Immature Granulocytes: 0.01 10*3/uL (ref 0.00–0.07)
Basophils Absolute: 0 10*3/uL (ref 0.0–0.1)
Basophils Relative: 1 %
Eosinophils Absolute: 0 10*3/uL (ref 0.0–0.5)
Eosinophils Relative: 1 %
HCT: 36.8 % (ref 36.0–46.0)
Hemoglobin: 12 g/dL (ref 12.0–15.0)
Immature Granulocytes: 0 %
Lymphocytes Relative: 30 %
Lymphs Abs: 1.3 10*3/uL (ref 0.7–4.0)
MCH: 29.1 pg (ref 26.0–34.0)
MCHC: 32.6 g/dL (ref 30.0–36.0)
MCV: 89.1 fL (ref 80.0–100.0)
Monocytes Absolute: 0.3 10*3/uL (ref 0.1–1.0)
Monocytes Relative: 6 %
Neutro Abs: 2.7 10*3/uL (ref 1.7–7.7)
Neutrophils Relative %: 62 %
Platelets: 330 10*3/uL (ref 150–400)
RBC: 4.13 MIL/uL (ref 3.87–5.11)
RDW: 12.2 % (ref 11.5–15.5)
WBC: 4.4 10*3/uL (ref 4.0–10.5)
nRBC: 0 % (ref 0.0–0.2)

## 2021-02-15 LAB — BASIC METABOLIC PANEL
Anion gap: 8 (ref 5–15)
BUN: 11 mg/dL (ref 6–20)
CO2: 25 mmol/L (ref 22–32)
Calcium: 9.3 mg/dL (ref 8.9–10.3)
Chloride: 102 mmol/L (ref 98–111)
Creatinine, Ser: 0.88 mg/dL (ref 0.44–1.00)
GFR, Estimated: >60 mL/min (ref >60)
Glucose, Bld: 103 mg/dL — ABNORMAL HIGH (ref 70–99)
Potassium: 3.4 mmol/L — ABNORMAL LOW (ref 3.5–5.1)
Sodium: 135 mmol/L (ref 135–145)

## 2021-02-15 LAB — TROPONIN I (HIGH SENSITIVITY): Troponin I (High Sensitivity): <2 ng/L (ref <18)

## 2021-02-15 LAB — PREGNANCY, URINE: Preg Test, Ur: NEGATIVE

## 2021-02-15 MED ORDER — ACETAMINOPHEN 325 MG PO TABS
650.0000 mg | ORAL_TABLET | Freq: Once | ORAL | Status: AC
  Administered 2021-02-15: 650 mg via ORAL
  Filled 2021-02-15: qty 2

## 2021-02-15 NOTE — Discharge Instructions (Addendum)
You were seen in the emergency department for headache right-sided facial pain and spasm and rapid heart rate that occurred yesterday.  You had blood work EKG and a CAT scan of your head that did not show any significant abnormalities.  Please follow-up with your primary care doctor and return to the emergency department if any worsening or concerning symptoms.
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
## 2021-02-15 NOTE — ED Notes (Signed)
Patient transported to CT 

## 2021-03-11 ENCOUNTER — Other Ambulatory Visit: Payer: Self-pay

## 2021-03-11 ENCOUNTER — Emergency Department (HOSPITAL_BASED_OUTPATIENT_CLINIC_OR_DEPARTMENT_OTHER)
Admission: EM | Admit: 2021-03-11 | Discharge: 2021-03-11 | Disposition: A | Discharge status: Home / Self Care | Payer: BLUE CROSS/BLUE SHIELD | Attending: Emergency Medicine | Admitting: Emergency Medicine

## 2021-03-11 DIAGNOSIS — R519 Headache, unspecified: Secondary | ICD-10-CM

## 2021-03-11 DIAGNOSIS — R110 Nausea: Secondary | ICD-10-CM | POA: Insufficient documentation

## 2021-03-11 MED ORDER — BUTALBITAL-APAP-CAFFEINE 50-325-40 MG PO TABS: 1.0000 | ORAL_TABLET | Freq: Four times a day (QID) | ORAL | 0 refills | Status: AC | PRN

## 2021-03-11 MED ORDER — DEXAMETHASONE SODIUM PHOSPHATE 10 MG/ML IJ SOLN
10.0000 mg | Freq: Once | INTRAMUSCULAR | Status: AC
  Administered 2021-03-11: 10 mg via INTRAMUSCULAR
  Filled 2021-03-11: qty 1

## 2021-03-11 MED ORDER — BUTALBITAL-APAP-CAFFEINE 50-325-40 MG PO TABS: 1.0000 | ORAL_TABLET | Freq: Once | ORAL | Status: DC

## 2021-03-11 MED ORDER — PREDNISONE 10 MG (21) PO TBPK: ORAL_TABLET | Freq: Every day | ORAL | 0 refills | Status: AC

## 2021-03-11 NOTE — ED Triage Notes (Signed)
Pt reports HA x 1 month. States she was seen for it and scans were done but has had a HA everyday since being discharged. Took excedrine migraine and aleve with no relief.

## 2021-03-11 NOTE — ED Notes (Signed)
Fioricet unable to be administered due to medication not being supplied. Pt was sent home with a prescription for this medication and explained when and how to take this medication. Pt verbalized understanding and was provided time to ask questions about medication. All questions answered. Pt discharged home in NAD.
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
## 2021-03-11 NOTE — Discharge Instructions (Addendum)
You were seen in the emergency department for recurrent right-sided headache  You had a CT scan on 4/19 that was normal  We discussed the possible causes of your headache including occipital neuralgia, muscular tension and less likely concussion.  Please see the included information on general causes of a headache  I have prescribed you a taper of prednisone which is a stapler that can help with breaking the cycle of headache.  I have also prescribed you Fioricet to take as needed when the headache is particularly bad.  Try not to overtake acetaminophen, ibuprofen as frequent daily repeat doses of these medicines can cause you rebound headache  Stable hydrated.  Rest.  Do not skip meals.  Follow-up with headache specialist (neurology) in 1 to 2 weeks if your headache is not improving or recurs  Return to the ED for sudden severe headache that feels different, stroke symptoms we discussed, fevers, neck stiffness

## 2021-08-07 ENCOUNTER — Emergency Department (HOSPITAL_BASED_OUTPATIENT_CLINIC_OR_DEPARTMENT_OTHER): Payer: Self-pay

## 2021-08-07 ENCOUNTER — Other Ambulatory Visit: Payer: Self-pay

## 2021-08-07 ENCOUNTER — Emergency Department (HOSPITAL_BASED_OUTPATIENT_CLINIC_OR_DEPARTMENT_OTHER)
Admission: EM | Admit: 2021-08-07 | Discharge: 2021-08-07 | Disposition: A | Discharge status: Home / Self Care | Payer: Self-pay | Attending: Student | Admitting: Student

## 2021-08-07 ENCOUNTER — Encounter (HOSPITAL_BASED_OUTPATIENT_CLINIC_OR_DEPARTMENT_OTHER): Payer: Self-pay

## 2021-08-07 DIAGNOSIS — R1013 Epigastric pain: Secondary | ICD-10-CM | POA: Insufficient documentation

## 2021-08-07 MED ORDER — OMEPRAZOLE 20 MG PO CPDR: 20.0000 mg | DELAYED_RELEASE_CAPSULE | Freq: Every day | ORAL | 0 refills | Status: AC

## 2021-08-07 MED ORDER — LIDOCAINE VISCOUS HCL 2 % MT SOLN
15.0000 mL | Freq: Once | OROMUCOSAL | Status: AC
  Administered 2021-08-07: 15 mL via ORAL
  Filled 2021-08-07: qty 15

## 2021-08-07 MED ORDER — ALUM & MAG HYDROXIDE-SIMETH 200-200-20 MG/5ML PO SUSP
30.0000 mL | Freq: Once | ORAL | Status: AC
  Administered 2021-08-07: 30 mL via ORAL
  Filled 2021-08-07: qty 30

## 2021-08-07 NOTE — Discharge Instructions (Addendum)
Imaging and exam are reassuring.  Suspect acid reflux.  Started  you on an acid pill please take as prescribed.  Also given information about foods to avoid please read.  Follow-up with your PCP for further evaluation.  Come back to the emergency department if you develop chest pain, shortness of breath, severe abdominal pain, uncontrolled nausea, vomiting, diarrhea.

## 2021-08-07 NOTE — ED Notes (Signed)
Leads in right position

## 2021-08-07 NOTE — ED Provider Notes (Signed)
[facility] EMERGENCY DEPARTMENT Provider Note   CSN: [id number] Arrival date & time: 08/07/21  1946     History Chief Complaint  Patient presents with   Back Pain    Latoya Bowen is a 30 y.o. female.  HPI  Patient with no significant medical history presents to the emergency department with chief complaint of intermittent back twinges and epigastric pressure.  Patient states she has been suffering from this for about 5 years and presents today as she is concerned that this might have acid reflux.  She states that this is intermittent worsened after she drinks soda.  She states that the back twinge is in the middle of her back, does not radiate, the pressure is in her epigastric region this also does not radiate, she has no associated nausea, vomiting, constipation, diarrhea, she denies  chest pain, shortness of breath, she denies pedal edema, orthopnea, she has no significant cardiac history, no history of PEs or DVTs she is currently not on hormone therapy.  She denies alleviating factors, has no other complaints at this time.  History reviewed. No pertinent past medical history.  Patient Active Problem List   Diagnosis Date Noted   Active labor 07/08/2012    Past Surgical History:  Procedure Laterality Date   TONSILLECTOMY       OB History     Gravida  2   Para  1   Term  1   Preterm      AB  1   Living  1      SAB      IAB  1   Ectopic      Multiple      Live Births  1           No family history on file.  Social History   Tobacco Use   Smoking status: Never   Smokeless tobacco: Never  Vaping Use   Vaping Use: Every day  Substance Use Topics   Alcohol use: Yes    Comment: occ   Drug use: Not Currently    Types: Marijuana    Home Medications Prior to Admission medications   Medication Sig Start Date End Date Taking? Authorizing Provider  omeprazole (PRILOSEC) 20 MG capsule Take 1 capsule (20 mg total) by mouth daily.  08/07/21 09/06/21 Yes Carroll Sage, PA-C  butalbital-acetaminophen-caffeine (FIORICET) [phone] MG tablet Take 1-2 tablets by mouth every 6 (six) hours as needed for headache. 03/11/21 03/11/22  Liberty Handy, PA-C  Fluocin-Hydroquinone-Tretinoin (TRI-LUMA) 0.01-4-0.05 % CREA AFFECTED AREA(S) AT BEDTIME ONCE DAILY 01/11/18   [provider]  gentamicin (GARAMYCIN) 0.3 % ophthalmic solution Place 2 drops into the left eye 4 (four) times daily. 11/24/19   Geoffery Lyons, MD  ibuprofen (ADVIL,MOTRIN) 800 MG tablet Take 1 tablet (800 mg total) by mouth every 8 (eight) hours as needed. 08/24/18   Lawyer, Cristal Deer, PA-C  Multiple Vitamin (MULTIVITAMIN) tablet Take 1 tablet by mouth daily.    [provider]  predniSONE (STERAPRED UNI-PAK 21 TAB) 10 MG (21) TBPK tablet Take by mouth daily. Take 6 tabs by mouth daily  for 2 days, then 5 tabs for 2 days, then 4 tabs for 2 days, then 3 tabs for 2 days, 2 tabs for 2 days, then 1 tab by mouth daily for 2 days 03/11/21   Liberty Handy, PA-C  trimethoprim-polymyxin b (POLYTRIM) ophthalmic solution Place 1 drop into the left eye every 4 (four) hours. 08/05/18   Kohut,  Jeannett Senior, MD    Allergies    Patient has no known allergies.  Review of Systems   Review of Systems  Constitutional:  Negative for chills and fever.  HENT:  Negative for congestion.   Respiratory:  Negative for shortness of breath.   Cardiovascular:  Negative for chest pain.  Gastrointestinal:  Negative for abdominal pain, diarrhea, nausea and vomiting.  Genitourinary:  Negative for enuresis.  Musculoskeletal:  Negative for back pain.  Skin:  Negative for rash.  Neurological:  Negative for dizziness.  Hematological:  Does not bruise/bleed easily.   Physical Exam Updated Vital Signs BP (!) 144/87 (BP Location: Left Arm)   Pulse 94   Temp 99 F (37.2 C) (Oral)   Resp 18   Ht 5\' 8"  (1.727 m)   Wt 73.5 kg   LMP 07/20/2021   SpO2 100%   BMI 24.63 kg/m    Physical Exam Vitals and nursing note reviewed.  Constitutional:      General: She is not in acute distress.    Appearance: She is not ill-appearing.  HENT:     Head: Normocephalic and atraumatic.     Nose: No congestion.  Eyes:     Conjunctiva/sclera: Conjunctivae normal.  Cardiovascular:     Rate and Rhythm: Normal rate and regular rhythm.     Pulses: Normal pulses.     Heart sounds: No murmur heard.   No friction rub. No gallop.  Pulmonary:     Effort: No respiratory distress.     Breath sounds: No wheezing, rhonchi or rales.  Chest:     Chest wall: No tenderness.  Abdominal:     Palpations: Abdomen is soft.     Tenderness: There is no abdominal tenderness. There is no right CVA tenderness or left CVA tenderness.     Comments: Abdomen nondistended dull to percussion, normoactive bowel sounds, nontender to palpation, no guarding, rebound times, peritoneal sign.  Musculoskeletal:     Right lower leg: No edema.     Left lower leg: No edema.  Skin:    General: Skin is warm and dry.  Neurological:     Mental Status: She is alert.  Psychiatric:        Mood and Affect: Mood normal.    ED Results / Procedures / Treatments   Labs (all labs ordered are listed, but only abnormal results are displayed) Labs Reviewed - No data to display  EKG EKG Interpretation  Date/Time:  Wednesday August 07 2021 21:00:04 EDT Ventricular Rate:  88 PR Interval:  160 QRS Duration: 81 QT Interval:  335 QTC Calculation: 406 R Axis:   109 Text Interpretation: Right and left arm electrode reversal, interpretation assumes no reversal Sinus rhythm Borderline right axis deviation Confirmed by Kommor, [place] ([id number]) on 08/07/2021 10:01:12 PM  Radiology DG Chest 2 View  Result Date: 08/07/2021 CLINICAL DATA:  Chest pressure EXAM: CHEST - 2 VIEW COMPARISON:  None. FINDINGS: The heart size and mediastinal contours are within normal limits. Both lungs are clear. The visualized skeletal  structures are unremarkable. IMPRESSION: No active cardiopulmonary disease. Electronically Signed   By: 08/09/2021 M.D.   On: 08/07/2021 21:14    Procedures Procedures   Medications Ordered in ED Medications  alum & mag hydroxide-simeth (MAALOX/MYLANTA) 200-200-20 MG/5ML suspension 30 mL (30 mLs Oral Given 08/07/21 2130)    And  lidocaine (XYLOCAINE) 2 % viscous mouth solution 15 mL (15 mLs Oral Given 08/07/21 2130)    ED Course  I have  reviewed the triage vital signs and the nursing notes.  Pertinent labs & imaging results that were available during my care of the patient were reviewed by me and considered in my medical decision making (see chart for details).    MDM Rules/Calculators/A&P                          Initial impression-presents with epigastric pressure, she is alert, does not appear acute stress, vital signs reassuring.  Will obtain chest x-ray EKG provide GI cocktail and reassess.  Work-up-chest x-ray unremarkable, EKG sinus without signs of ischemia.  Reassessment-patient reassessed after GI cocktail states she is feeling much better has no complaints at time patient agreed for discharge.  Rule out- I have low suspicion for ACS as history is atypical, patient has no cardiac history, EKG was sinus rhythm without signs of ischemia, troponins were deferred as patient had no chest pain.  Low suspicion for PE as patient denies pleuritic chest pain, shortness of breath, patient denies leg pain, no pedal edema noted on exam, patient was PERC negative.  Low suspicion for AAA or aortic dissection as history is atypical, patient has low risk factors.  Low suspicion for liver or gallbladder normality as she has no upper quadrant tenderness, negative Murphy sign.  There is risk for pancreatitis presentation atypical, patient has no risk factors.  Low suspicion for systemic infection as patient is nontoxic-appearing, vital signs reassuring, no obvious source infection noted on  exam.   Plan-  Epigastric pressure-likely secondary due to acid reflux, will provide her with PPI, recommend foods that will decrease acid reflux follow-up with PCP for further evaluation.  Vital signs have remained stable, no indication for hospital admission.  Patient given at home care as well strict return precautions.  Patient verbalized that they understood agreed to said plan.  Final Clinical Impression(s) / ED Diagnoses Final diagnoses:  Epigastric pressure    Rx / DC Orders ED Discharge Orders          Ordered    omeprazole (PRILOSEC) 20 MG capsule  Daily        08/07/21 2254             Carroll Sage, PA-C 08/07/21 2257    Glendora Score, MD 08/08/21 0003

## 2021-08-07 NOTE — ED Triage Notes (Signed)
Pt c/o "tingling like feeling to the middle of my back" x 1 year-states feeling worse when she was drinking sodas-NAD-steady gait
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
## 2021-10-29 ENCOUNTER — Encounter (HOSPITAL_BASED_OUTPATIENT_CLINIC_OR_DEPARTMENT_OTHER): Payer: Self-pay | Admitting: Emergency Medicine

## 2021-10-29 ENCOUNTER — Emergency Department (HOSPITAL_BASED_OUTPATIENT_CLINIC_OR_DEPARTMENT_OTHER)
Admission: EM | Admit: 2021-10-29 | Discharge: 2021-10-29 | Disposition: A | Discharge status: Home / Self Care | Payer: BC Managed Care – PPO | Attending: Emergency Medicine | Admitting: Emergency Medicine

## 2021-10-29 ENCOUNTER — Other Ambulatory Visit: Payer: Self-pay

## 2021-10-29 DIAGNOSIS — N760 Acute vaginitis: Secondary | ICD-10-CM | POA: Insufficient documentation

## 2021-10-29 DIAGNOSIS — B9689 Other specified bacterial agents as the cause of diseases classified elsewhere: Secondary | ICD-10-CM | POA: Diagnosis not present

## 2021-10-29 DIAGNOSIS — Z113 Encounter for screening for infections with a predominantly sexual mode of transmission: Secondary | ICD-10-CM | POA: Diagnosis present

## 2021-10-29 LAB — PREGNANCY, URINE: Preg Test, Ur: NEGATIVE

## 2021-10-29 LAB — URINALYSIS, ROUTINE W REFLEX MICROSCOPIC
Bilirubin Urine: NEGATIVE
Glucose, UA: NEGATIVE mg/dL
Hgb urine dipstick: NEGATIVE
Ketones, ur: NEGATIVE mg/dL
Leukocytes,Ua: NEGATIVE
Nitrite: NEGATIVE
Protein, ur: NEGATIVE mg/dL
Specific Gravity, Urine: >=1.03 (ref 1.005–1.030)
pH: 6 (ref 5.0–8.0)

## 2021-10-29 LAB — WET PREP, GENITAL
Sperm: NONE SEEN
Trich, Wet Prep: NONE SEEN
WBC, Wet Prep HPF POC: >=10 — AB (ref <10)
Yeast Wet Prep HPF POC: NONE SEEN

## 2021-10-29 MED ORDER — METRONIDAZOLE 500 MG PO TABS: 500.0000 mg | ORAL_TABLET | Freq: Two times a day (BID) | ORAL | 0 refills | Status: DC

## 2021-10-29 NOTE — Discharge Instructions (Addendum)
As we discussed your wet prep was significant for bacterial vaginosis which is not a sexually transmitted infection but an overgrowth of bacteria that are naturally occurring in the vagina, it can cause irritation and pain with sex if not treated so I do recommend treatment.  As we discussed you can have a very unpleasant feeling of withdrawal if you do drink alcohol while taking this antibiotic so please refrain from drinking alcohol while taking this.  Else of your gonorrhea, chlamydia probe will be available in your portal in 24 to 48 hours.  Please follow-up with the health clinic if these results come back positive, and inform your sexual partners.

## 2021-10-29 NOTE — ED Notes (Signed)
Pt discharged to home. Discharge instructions have been discussed with patient and/or family members. Pt verbally acknowledges understanding d/c instructions, and endorses comprehension to checkout at registration before leaving.  °

## 2021-10-29 NOTE — ED Provider Notes (Signed)
[facility] EMERGENCY DEPARTMENT Provider Note   CSN: [id number] Arrival date & time: 10/29/21  [id number]     History Chief Complaint  Patient presents with   STD Screening    Latoya Bowen is a 30 y.o. female with no significant past medical history presents for STI screening today.  Patient reports that she had unprotected sex with a former partner around 10 days ago, and reports that she is unclear of his STI status at this time.  Patient denies any vaginal discharge, vaginal discomfort, itching, or known infection of the partner.  Patient denies any dyspareunia.  Patient denies concern for pregnancy, reports recent last menstrual period was normal for her.  Patient declines testing for HIV, syphilis at this time, mostly concern for GC, chlamydia.  HPI     History reviewed. No pertinent past medical history.  Patient Active Problem List   Diagnosis Date Noted   Active labor 07/08/2012    Past Surgical History:  Procedure Laterality Date   TONSILLECTOMY       OB History     Gravida  2   Para  1   Term  1   Preterm      AB  1   Living  1      SAB      IAB  1   Ectopic      Multiple      Live Births  1           History reviewed. No pertinent family history.  Social History   Tobacco Use   Smoking status: Never   Smokeless tobacco: Never  Vaping Use   Vaping Use: Every day  Substance Use Topics   Alcohol use: Yes    Comment: occ   Drug use: Not Currently    Types: Marijuana    Home Medications Prior to Admission medications   Medication Sig Start Date End Date Taking? Authorizing Provider  metroNIDAZOLE (FLAGYL) 500 MG tablet Take 1 tablet (500 mg total) by mouth 2 (two) times daily. 10/29/21  Yes Prerna Harold H, PA-C  butalbital-acetaminophen-caffeine (FIORICET) [phone] MG tablet Take 1-2 tablets by mouth every 6 (six) hours as needed for headache. 03/11/21 03/11/22  Kinnie Feil, PA-C   Fluocin-Hydroquinone-Tretinoin (TRI-LUMA) 0.01-4-0.05 % CREA AFFECTED AREA(S) AT BEDTIME ONCE DAILY 01/11/18   [provider]  gentamicin (GARAMYCIN) 0.3 % ophthalmic solution Place 2 drops into the left eye 4 (four) times daily. 11/24/19   Veryl Speak, MD  ibuprofen (ADVIL,MOTRIN) 800 MG tablet Take 1 tablet (800 mg total) by mouth every 8 (eight) hours as needed. 08/24/18   Lawyer, Harrell Gave, PA-C  Multiple Vitamin (MULTIVITAMIN) tablet Take 1 tablet by mouth daily.    [provider]  omeprazole (PRILOSEC) 20 MG capsule Take 1 capsule (20 mg total) by mouth daily. 08/07/21 09/06/21  Marcello Fennel, PA-C  predniSONE (STERAPRED UNI-PAK 21 TAB) 10 MG (21) TBPK tablet Take by mouth daily. Take 6 tabs by mouth daily  for 2 days, then 5 tabs for 2 days, then 4 tabs for 2 days, then 3 tabs for 2 days, 2 tabs for 2 days, then 1 tab by mouth daily for 2 days 03/11/21   Kinnie Feil, PA-C  trimethoprim-polymyxin b (POLYTRIM) ophthalmic solution Place 1 drop into the left eye every 4 (four) hours. 08/05/18   Virgel Manifold, MD    Allergies    Patient has no known allergies.  Review of Systems   Review of  Systems  All other systems reviewed and are negative.  Physical Exam Updated Vital Signs Pulse 94    Temp 98.7 F (37.1 C) (Oral)    Resp 16    Ht 5\' 8"  (1.727 m)    Wt 79.8 kg    LMP 09/16/2021    SpO2 100%    Breastfeeding No    BMI 26.76 kg/m   Physical Exam Vitals and nursing note reviewed.  Constitutional:      General: She is not in acute distress.    Appearance: Normal appearance.  HENT:     Head: Normocephalic and atraumatic.  Eyes:     General:        Right eye: No discharge.        Left eye: No discharge.  Cardiovascular:     Rate and Rhythm: Normal rate and regular rhythm.  Pulmonary:     Effort: Pulmonary effort is normal. No respiratory distress.  Genitourinary:    General: Normal vulva.     Vagina: No vaginal discharge.     Comments: Normal  vaginal canal.  No cervical motion tenderness.  Multiparous cervix.  No palpable mass in adnexa, no tenderness palpation of suprapubic region. Musculoskeletal:        General: No deformity.  Skin:    General: Skin is warm and dry.  Neurological:     Mental Status: She is alert and oriented to person, place, and time.  Psychiatric:        Mood and Affect: Mood normal.        Behavior: Behavior normal.    ED Results / Procedures / Treatments   Labs (all labs ordered are listed, but only abnormal results are displayed) Labs Reviewed  WET PREP, GENITAL - Abnormal; Notable for the following components:      Result Value   Clue Cells Wet Prep HPF POC PRESENT (*)    WBC, Wet Prep HPF POC >=10 (*)    All other components within normal limits  URINALYSIS, ROUTINE W REFLEX MICROSCOPIC - Abnormal; Notable for the following components:   APPearance CLOUDY (*)    All other components within normal limits  PREGNANCY, URINE  GC/CHLAMYDIA PROBE AMP ([place]) NOT AT [facility]    EKG None  Radiology No results found.  Procedures Procedures   Medications Ordered in ED Medications - No data to display  ED Course  I have reviewed the triage vital signs and the nursing notes.  Pertinent labs & imaging results that were available during my care of the patient were reviewed by me and considered in my medical decision making (see chart for details).    MDM Rules/Calculators/A&P                         Overall well-appearing female presents with need for asymptomatic STI screening.  Patient reports unprotected sex x1 with former partner around 10 days ago.  Patient denies any symptoms including vaginal discharge, vaginal discomfort, dysuria, urinary frequency, hematuria, dyspareunia.  Patient reports that she is just trying to be safe given unknown status of recent partner.  Urinalysis is unremarkable.  Wet prep shows clue cells.  We will treat for bacterial vaginosis.  Discussed side effects  of Flagyl with patient.  Patient understands and agrees to plan to treat.  Discussed that I do not recommend prophylactic treatment for GC, chlamydia in context of no symptoms, and patient informed that her results will be available in 24 to  48 hours and that she can follow-up with the health department if she has any positive results. Final Clinical Impression(s) / ED Diagnoses Final diagnoses:  Routine screening for STI (sexually transmitted infection)  Bacterial vaginosis    Rx / DC Orders ED Discharge Orders          Ordered    metroNIDAZOLE (FLAGYL) 500 MG tablet  2 times daily        10/29/21 1100             Carlus Pavlov [place], PA-C 10/29/21 1102    Margette Fast, MD 10/30/21 [phone]

## 2021-10-29 NOTE — ED Triage Notes (Signed)
Pt here for STD testing.  She denies symptoms but had unprotected sex.

## 2021-10-30 LAB — GC/CHLAMYDIA PROBE AMP (~~LOC~~) NOT AT ARMC
Chlamydia: NEGATIVE
Comment: NEGATIVE
Comment: NORMAL
Neisseria Gonorrhea: NEGATIVE

## 2022-01-21 ENCOUNTER — Emergency Department (HOSPITAL_BASED_OUTPATIENT_CLINIC_OR_DEPARTMENT_OTHER)
Admission: EM | Admit: 2022-01-21 | Discharge: 2022-01-21 | Disposition: A | Discharge status: Home / Self Care | Payer: BC Managed Care – PPO | Attending: Emergency Medicine | Admitting: Emergency Medicine

## 2022-01-21 ENCOUNTER — Encounter (HOSPITAL_BASED_OUTPATIENT_CLINIC_OR_DEPARTMENT_OTHER): Payer: Self-pay | Admitting: Emergency Medicine

## 2022-01-21 ENCOUNTER — Emergency Department (HOSPITAL_BASED_OUTPATIENT_CLINIC_OR_DEPARTMENT_OTHER): Payer: BC Managed Care – PPO

## 2022-01-21 ENCOUNTER — Other Ambulatory Visit: Payer: Self-pay

## 2022-01-21 DIAGNOSIS — R102 Pelvic and perineal pain unspecified side: Secondary | ICD-10-CM

## 2022-01-21 DIAGNOSIS — N760 Acute vaginitis: Secondary | ICD-10-CM

## 2022-01-21 DIAGNOSIS — N939 Abnormal uterine and vaginal bleeding, unspecified: Secondary | ICD-10-CM | POA: Diagnosis present

## 2022-01-21 DIAGNOSIS — B9689 Other specified bacterial agents as the cause of diseases classified elsewhere: Secondary | ICD-10-CM | POA: Insufficient documentation

## 2022-01-21 DIAGNOSIS — N938 Other specified abnormal uterine and vaginal bleeding: Secondary | ICD-10-CM

## 2022-01-21 DIAGNOSIS — B3731 Acute candidiasis of vulva and vagina: Secondary | ICD-10-CM | POA: Diagnosis not present

## 2022-01-21 LAB — URINALYSIS, ROUTINE W REFLEX MICROSCOPIC
Bilirubin Urine: NEGATIVE
Glucose, UA: NEGATIVE mg/dL
Ketones, ur: NEGATIVE mg/dL
Nitrite: NEGATIVE
Protein, ur: NEGATIVE mg/dL
Specific Gravity, Urine: 1.025 (ref 1.005–1.030)
pH: 7 (ref 5.0–8.0)

## 2022-01-21 LAB — COMPREHENSIVE METABOLIC PANEL
ALT: 15 U/L (ref 0–44)
AST: 19 U/L (ref 15–41)
Albumin: 4.1 g/dL (ref 3.5–5.0)
Alkaline Phosphatase: 65 U/L (ref 38–126)
Anion gap: 7 (ref 5–15)
BUN: 10 mg/dL (ref 6–20)
CO2: 25 mmol/L (ref 22–32)
Calcium: 9.1 mg/dL (ref 8.9–10.3)
Chloride: 103 mmol/L (ref 98–111)
Creatinine, Ser: 0.99 mg/dL (ref 0.44–1.00)
GFR, Estimated: >60 mL/min (ref >60)
Glucose, Bld: 103 mg/dL — ABNORMAL HIGH (ref 70–99)
Potassium: 4 mmol/L (ref 3.5–5.1)
Sodium: 135 mmol/L (ref 135–145)
Total Bilirubin: 0.8 mg/dL (ref 0.3–1.2)
Total Protein: 7.7 g/dL (ref 6.5–8.1)

## 2022-01-21 LAB — PREGNANCY, URINE: Preg Test, Ur: NEGATIVE

## 2022-01-21 LAB — CBC WITH DIFFERENTIAL/PLATELET
Abs Immature Granulocytes: 0.02 10*3/uL (ref 0.00–0.07)
Basophils Absolute: 0.1 10*3/uL (ref 0.0–0.1)
Basophils Relative: 1 %
Eosinophils Absolute: 0.1 10*3/uL (ref 0.0–0.5)
Eosinophils Relative: 1 %
HCT: 35.7 % — ABNORMAL LOW (ref 36.0–46.0)
Hemoglobin: 11.5 g/dL — ABNORMAL LOW (ref 12.0–15.0)
Immature Granulocytes: 0 %
Lymphocytes Relative: 17 %
Lymphs Abs: 1.3 10*3/uL (ref 0.7–4.0)
MCH: 28.2 pg (ref 26.0–34.0)
MCHC: 32.2 g/dL (ref 30.0–36.0)
MCV: 87.5 fL (ref 80.0–100.0)
Monocytes Absolute: 0.6 10*3/uL (ref 0.1–1.0)
Monocytes Relative: 8 %
Neutro Abs: 5.7 10*3/uL (ref 1.7–7.7)
Neutrophils Relative %: 73 %
Platelets: 297 10*3/uL (ref 150–400)
RBC: 4.08 MIL/uL (ref 3.87–5.11)
RDW: 12.8 % (ref 11.5–15.5)
WBC: 7.7 10*3/uL (ref 4.0–10.5)
nRBC: 0 % (ref 0.0–0.2)

## 2022-01-21 LAB — WET PREP, GENITAL
Sperm: NONE SEEN
Trich, Wet Prep: NONE SEEN
WBC, Wet Prep HPF POC: >=10 — AB (ref <10)

## 2022-01-21 LAB — URINALYSIS, MICROSCOPIC (REFLEX)

## 2022-01-21 MED ORDER — SODIUM CHLORIDE 0.9 % IV BOLUS
1000.0000 mL | Freq: Once | INTRAVENOUS | Status: AC
  Administered 2022-01-21: 1000 mL via INTRAVENOUS

## 2022-01-21 MED ORDER — METRONIDAZOLE 500 MG PO TABS: 500.0000 mg | ORAL_TABLET | Freq: Two times a day (BID) | ORAL | 0 refills | Status: AC

## 2022-01-21 MED ORDER — IBUPROFEN 600 MG PO TABS: 600.0000 mg | ORAL_TABLET | Freq: Four times a day (QID) | ORAL | 0 refills | Status: AC | PRN

## 2022-01-21 MED ORDER — KETOROLAC TROMETHAMINE 30 MG/ML IJ SOLN
30.0000 mg | Freq: Once | INTRAMUSCULAR | Status: AC
  Administered 2022-01-21: 30 mg via INTRAVENOUS
  Filled 2022-01-21: qty 1

## 2022-01-21 MED ORDER — FLUCONAZOLE 150 MG PO TABS: 150.0000 mg | ORAL_TABLET | Freq: Every day | ORAL | 0 refills | Status: AC

## 2022-01-21 NOTE — ED Provider Notes (Signed)
?[facility] EMERGENCY DEPARTMENT ?Provider Note ? ? ?CSN: [id number] ?Arrival date & time: 01/21/22  0756 ? ?  ? ?History ? ?Chief Complaint  ?Patient presents with  ? Vaginal Bleeding  ? Abdominal Pain  ? ? ?Latoya Bowen is a 31 y.o. female. ? ?Pt is a 31 yo female with no significant pmhx who normally has regular periods.  Her LMP was 2/23 and then started having bleeding and cramping again this week.  She is not on any birth control.  She is not sexually active.  She denies any other sx. ? ? ?  ? ?Home Medications ?Prior to Admission medications   ?Medication Sig Start Date End Date Taking? Authorizing Provider  ?butalbital-acetaminophen-caffeine (FIORICET) 50-325-40 MG tablet Take 1-2 tablets by mouth every 6 (six) hours as needed for headache. 03/11/21 03/11/22  Liberty HandyGibbons, Claudia J, PA-C  ?fluconazole (DIFLUCAN) 150 MG tablet Take 1 tablet (150 mg total) by mouth daily. 01/21/22  Yes Jacalyn LefevreHaviland, Lalani Winkles, MD  ?Fluocin-Hydroquinone-Tretinoin (TRI-LUMA) 0.01-4-0.05 % CREA AFFECTED AREA(S) AT BEDTIME ONCE DAILY 01/11/18   [provider]  ?gentamicin (GARAMYCIN) 0.3 % ophthalmic solution Place 2 drops into the left eye 4 (four) times daily. 11/24/19   Geoffery Lyonselo, Douglas, MD  ?ibuprofen (ADVIL) 600 MG tablet Take 1 tablet (600 mg total) by mouth every 6 (six) hours as needed. 01/21/22  Yes Jacalyn LefevreHaviland, Vannia Pola, MD  ?metroNIDAZOLE (FLAGYL) 500 MG tablet Take 1 tablet (500 mg total) by mouth 2 (two) times daily. 01/21/22   Jacalyn LefevreHaviland, Grizelda Piscopo, MD  ?Multiple Vitamin (MULTIVITAMIN) tablet Take 1 tablet by mouth daily.    [provider]  ?omeprazole (PRILOSEC) 20 MG capsule Take 1 capsule (20 mg total) by mouth daily. 08/07/21 09/06/21  Carroll SageFaulkner, William J, PA-C  ?predniSONE (STERAPRED UNI-PAK 21 TAB) 10 MG (21) TBPK tablet Take by mouth daily. Take 6 tabs by mouth daily  for 2 days, then 5 tabs for 2 days, then 4 tabs for 2 days, then 3 tabs for 2 days, 2 tabs for 2 days, then 1 tab by mouth daily for 2 days 03/11/21    Liberty HandyGibbons, Claudia J, PA-C  ?trimethoprim-polymyxin b (POLYTRIM) ophthalmic solution Place 1 drop into the left eye every 4 (four) hours. 08/05/18   Raeford RazorKohut, Stephen, MD  ?   ? ?Allergies    ?Patient has no known allergies.   ? ?Review of Systems   ?Review of Systems  ?Gastrointestinal:  Positive for abdominal pain.  ?Genitourinary:  Positive for vaginal bleeding.  ?All other systems reviewed and are negative. ? ?Physical Exam ?Updated Vital Signs ?BP 131/83 (BP Location: Right Arm)   Pulse 82   Temp 98.3 ?F (36.8 ?C) (Oral)   Resp 20   Ht 5\' 8"  (1.727 m)   Wt 81.6 kg   LMP 01/02/2022 (Exact Date)   SpO2 98%   BMI 27.37 kg/m?  ?Physical Exam ?Vitals and nursing note reviewed. Exam conducted with a chaperone present.  ?Constitutional:   ?   Appearance: She is well-developed.  ?HENT:  ?   Head: Normocephalic and atraumatic.  ?   Mouth/Throat:  ?   Mouth: Mucous membranes are moist.  ?   Pharynx: Oropharynx is clear.  ?Eyes:  ?   Extraocular Movements: Extraocular movements intact.  ?   Pupils: Pupils are equal, round, and reactive to light.  ?Cardiovascular:  ?   Rate and Rhythm: Normal rate and regular rhythm.  ?Pulmonary:  ?   Effort: Pulmonary effort is normal.  ?   Breath  sounds: Normal breath sounds.  ?Abdominal:  ?   General: Abdomen is flat. Bowel sounds are normal.  ?   Palpations: Abdomen is soft.  ?   Tenderness: There is abdominal tenderness in the suprapubic area.  ?Genitourinary: ?   Exam position: Lithotomy position.  ?   Vagina: Normal.  ?   Cervix: Cervical bleeding present.  ?   Uterus: Tender.   ?   Adnexa:     ?   Right: Tenderness present.     ?   Left: Tenderness present.   ?Skin: ?   General: Skin is warm.  ?   Capillary Refill: Capillary refill takes less than 2 seconds.  ?Neurological:  ?   General: No focal deficit present.  ?   Mental Status: She is alert and oriented to person, place, and time.  ?Psychiatric:     ?   Mood and Affect: Mood normal.     ?   Behavior: Behavior normal.   ? ? ?ED Results / Procedures / Treatments   ?Labs ?(all labs ordered are listed, but only abnormal results are displayed) ?Labs Reviewed  ?WET PREP, GENITAL - Abnormal; Notable for the following components:  ?    Result Value  ? Yeast Wet Prep HPF POC PRESENT (*)   ? Clue Cells Wet Prep HPF POC PRESENT (*)   ? WBC, Wet Prep HPF POC >=10 (*)   ? All other components within normal limits  ?URINALYSIS, ROUTINE W REFLEX MICROSCOPIC - Abnormal; Notable for the following components:  ? APPearance HAZY (*)   ? Hgb urine dipstick MODERATE (*)   ? Leukocytes,Ua TRACE (*)   ? All other components within normal limits  ?COMPREHENSIVE METABOLIC PANEL - Abnormal; Notable for the following components:  ? Glucose, Bld 103 (*)   ? All other components within normal limits  ?CBC WITH DIFFERENTIAL/PLATELET - Abnormal; Notable for the following components:  ? Hemoglobin 11.5 (*)   ? HCT 35.7 (*)   ? All other components within normal limits  ?URINALYSIS, MICROSCOPIC (REFLEX) - Abnormal; Notable for the following components:  ? Bacteria, UA MANY (*)   ? All other components within normal limits  ?PREGNANCY, URINE  ?GC/CHLAMYDIA PROBE AMP ([place]) NOT AT [facility]  ? ? ?EKG ?None ? ?Radiology ?US PELVIC COMPLETE W TRANSVAGINAL AND TORSION R/O ? ?Result Date: 01/21/2022 ?CLINICAL DATA:  Pelvic pain EXAM: TRANSABDOMINAL AND TRANSVAGINAL ULTRASOUND OF PELVIS DOPPLER ULTRASOUND OF OVARIES TECHNIQUE: Both transabdominal and transvaginal ultrasound examinations of the pelvis were performed. Transabdominal technique was performed for global imaging of the pelvis including uterus, ovaries, adnexal regions, and pelvic cul-de-sac. It was necessary to proceed with endovaginal exam following the transabdominal exam to visualize the endometrium and ovaries. Color and duplex Doppler ultrasound was utilized to evaluate blood flow to the ovaries. COMPARISON:  None. FINDINGS: Uterus Measurements: 9.4 x 4.8 x 5.2 cm = volume: 122.1 mL. There is slightly  inhomogeneous echogenicity without discrete nodules in myometrium. Endometrium Thickness: 11 mm. No significant focal abnormality visualized. Possible trace amount of fluid is seen in the endometrial cavity. Right ovary Measurements: 4.9 x 1.9 by 2.1 cm = volume: 10.2 mL. Small follicles are seen. Left ovary Measurements: 5.2 x 1.3 x 1.4 cm = volume: 4.9 mL. There are subcentimeter follicles. Pulsed Doppler evaluation of both ovaries demonstrates normal low-resistance arterial and venous waveforms. Other findings Small amount of free fluid is seen in the pelvis. IMPRESSION: Small amount of free fluid in the pelvis may  be due to recent rupture of ovarian cyst or follicle. Pelvic sonogram is otherwise unremarkable. Electronically Signed   By: Ernie Avena M.D.   On: 01/21/2022 10:54   ? ?Procedures ?Procedures  ? ? ?Medications Ordered in ED ?Medications  ?sodium chloride 0.9 % bolus 1,000 mL (0 mLs Intravenous Stopped 01/21/22 1022)  ?ketorolac (TORADOL) 30 MG/ML injection 30 mg (30 mg Intravenous Given 01/21/22 0919)  ? ? ?ED Course/ Medical Decision Making/ A&P ?  ?                        ?Medical Decision Making ?Amount and/or Complexity of Data Reviewed ?Labs: ordered. ?Radiology: ordered. ? ?Risk ?Prescription drug management. ? ? ?This patient presents to the ED for concern of abd pain and dub, this involves an extensive number of treatment options, and is a complaint that carries with it a high risk of complications and morbidity.  The differential diagnosis includes pregnancy, ovarian cyst/torsion, dub ? ? ?Co morbidities that complicate the patient evaluation ? ?none ? ? ?Additional history obtained: ? ?Additional history obtained from epic chart review ? ? ?Lab Tests: ? ?I Ordered, and personally interpreted labs.  The pertinent results include:  preg neg ? ? ?Imaging Studies ordered: ? ?I ordered imaging studies including [place]  ?I independently visualized and interpreted imaging which showed  ?   ?IMPRESSION:  ?Small amount of free fluid in the pelvis may be due to recent  ?rupture of ovarian cyst or follicle. Pelvic sonogram is otherwise  ?unremarkable.  ?   ? ?I agree with the radiologist interpretation ? ? ?Cardiac

## 2022-01-21 NOTE — ED Triage Notes (Signed)
Pt states she had a period 2/23.  Pt states she has normal periods but had some breakthrough bleeding last week but then subsided.  Pt states she started having breakthrough bleeding today with cramping.  No birth control.  No sexual activity. ?

## 2022-01-22 LAB — GC/CHLAMYDIA PROBE AMP (~~LOC~~) NOT AT ARMC
Chlamydia: NEGATIVE
Comment: NEGATIVE
Comment: NORMAL
Neisseria Gonorrhea: NEGATIVE

## 2022-06-19 ENCOUNTER — Emergency Department (HOSPITAL_BASED_OUTPATIENT_CLINIC_OR_DEPARTMENT_OTHER): Payer: BC Managed Care – PPO

## 2022-06-19 ENCOUNTER — Emergency Department (HOSPITAL_BASED_OUTPATIENT_CLINIC_OR_DEPARTMENT_OTHER)
Admission: EM | Admit: 2022-06-19 | Discharge: 2022-06-19 | Disposition: A | Discharge status: Home / Self Care | Payer: BC Managed Care – PPO | Attending: Emergency Medicine | Admitting: Emergency Medicine

## 2022-06-19 ENCOUNTER — Encounter (HOSPITAL_BASED_OUTPATIENT_CLINIC_OR_DEPARTMENT_OTHER): Payer: Self-pay

## 2022-06-19 DIAGNOSIS — H9201 Otalgia, right ear: Secondary | ICD-10-CM | POA: Insufficient documentation

## 2022-06-19 DIAGNOSIS — R519 Headache, unspecified: Secondary | ICD-10-CM | POA: Diagnosis present

## 2022-06-19 LAB — CBC WITH DIFFERENTIAL/PLATELET
Abs Immature Granulocytes: 0.01 10*3/uL (ref 0.00–0.07)
Basophils Absolute: 0 10*3/uL (ref 0.0–0.1)
Basophils Relative: 1 %
Eosinophils Absolute: 0 10*3/uL (ref 0.0–0.5)
Eosinophils Relative: 1 %
HCT: 38.7 % (ref 36.0–46.0)
Hemoglobin: 12.3 g/dL (ref 12.0–15.0)
Immature Granulocytes: 0 %
Lymphocytes Relative: 31 %
Lymphs Abs: 1.4 10*3/uL (ref 0.7–4.0)
MCH: 26.7 pg (ref 26.0–34.0)
MCHC: 31.8 g/dL (ref 30.0–36.0)
MCV: 84.1 fL (ref 80.0–100.0)
Monocytes Absolute: 0.3 10*3/uL (ref 0.1–1.0)
Monocytes Relative: 7 %
Neutro Abs: 2.8 10*3/uL (ref 1.7–7.7)
Neutrophils Relative %: 60 %
Platelets: 377 10*3/uL (ref 150–400)
RBC: 4.6 MIL/uL (ref 3.87–5.11)
RDW: 12.8 % (ref 11.5–15.5)
WBC: 4.6 10*3/uL (ref 4.0–10.5)
nRBC: 0 % (ref 0.0–0.2)

## 2022-06-19 LAB — BASIC METABOLIC PANEL
Anion gap: 7 (ref 5–15)
BUN: 10 mg/dL (ref 6–20)
CO2: 27 mmol/L (ref 22–32)
Calcium: 9.6 mg/dL (ref 8.9–10.3)
Chloride: 102 mmol/L (ref 98–111)
Creatinine, Ser: 1 mg/dL (ref 0.44–1.00)
GFR, Estimated: >60 mL/min (ref >60)
Glucose, Bld: 95 mg/dL (ref 70–99)
Potassium: 3.7 mmol/L (ref 3.5–5.1)
Sodium: 136 mmol/L (ref 135–145)

## 2022-06-19 LAB — HCG, SERUM, QUALITATIVE: Preg, Serum: NEGATIVE

## 2022-06-19 LAB — SEDIMENTATION RATE: Sed Rate: 15 mm/hr (ref 0–22)

## 2022-06-19 MED ORDER — NAPROXEN 500 MG PO TABS: 500.0000 mg | ORAL_TABLET | Freq: Two times a day (BID) | ORAL | 0 refills | Status: AC

## 2022-06-19 MED ORDER — IOHEXOL 350 MG/ML SOLN
75.0000 mL | Freq: Once | INTRAVENOUS | Status: AC | PRN
Start: 2022-06-19 — End: 2022-06-19
  Administered 2022-06-19: 75 mL via INTRAVENOUS

## 2022-06-19 NOTE — ED Notes (Addendum)
Began having a HA a couple of weeks ago, was constant for one week and then became intermittent. Also felt like her body had some numbness, felt like she also had a panic attack. Denies any dizziness , nausea, vomiting. Able to eat and keep liquids down as well. Has rt sided HA at this time. No photosensitivity per pt statement

## 2022-06-19 NOTE — Discharge Instructions (Signed)
1.  Take naproxen twice daily as prescribed for the next week.  See if your symptoms improve. 2.  You may schedule a follow-up with an ear nose throat specialist.  Contact information for Dr. Elijah Birk has been included in your discharge instructions. 3.  Return to the emergency department if you have new worsening or concerning symptoms.  Especially return if you have any visual changes 4.  You may have trigeminal neuralgia.  You can review the information regarding this diagnosis. 5.  A CT scan with angiogram was done of your brain.  There was no sign of an aneurysm or tumor.  If your symptoms persist or there are new or concerning symptoms, you may also need an MRI.

## 2022-06-19 NOTE — ED Provider Notes (Signed)
[facility] EMERGENCY DEPARTMENT Provider Note   CSN: [id number] Arrival date & time: 06/19/22  0944     History  Chief Complaint  Patient presents with   Headache    Latoya Bowen is a 31 y.o. female.  HPI Patient reports she had a right-sided headache that started fairly acutely last week.  It is in her temple.  Pain seems to concentrate somewhat in front of her right ear.  She reports it is pulsatile in nature.  She reports pain sometimes spreads up towards her temple eye cheek and jaw.  Patient became concerned when she awakened this morning and felt that she had some numbness in the right arm and leg.  She felt dizzy and nauseated.  Patient has not had any fevers no chills no neck stiffness.  She has not had any associated visual changes.  No double vision no decreased vision.  Patient denies sore throat.  She has however noted some pain more around the right ear.  Denies any dental pain.  Pain was worse last week for several days and then abated but then returned this week.    Home Medications Prior to Admission medications   Medication Sig Start Date End Date Taking? Authorizing Provider  naproxen (NAPROSYN) 500 MG tablet Take 1 tablet (500 mg total) by mouth 2 (two) times daily. 06/19/22  Yes Arby Barrette, MD  fluconazole (DIFLUCAN) 150 MG tablet Take 1 tablet (150 mg total) by mouth daily. 01/21/22   Jacalyn Lefevre, MD  Fluocin-Hydroquinone-Tretinoin (TRI-LUMA) 0.01-4-0.05 % CREA AFFECTED AREA(S) AT BEDTIME ONCE DAILY 01/11/18   [provider]  gentamicin (GARAMYCIN) 0.3 % ophthalmic solution Place 2 drops into the left eye 4 (four) times daily. 11/24/19   Geoffery Lyons, MD  ibuprofen (ADVIL) 600 MG tablet Take 1 tablet (600 mg total) by mouth every 6 (six) hours as needed. 01/21/22   Jacalyn Lefevre, MD  metroNIDAZOLE (FLAGYL) 500 MG tablet Take 1 tablet (500 mg total) by mouth 2 (two) times daily. 01/21/22   Jacalyn Lefevre, MD  Multiple Vitamin  (MULTIVITAMIN) tablet Take 1 tablet by mouth daily.    [provider]  omeprazole (PRILOSEC) 20 MG capsule Take 1 capsule (20 mg total) by mouth daily. 08/07/21 09/06/21  Carroll Sage, PA-C  predniSONE (STERAPRED UNI-PAK 21 TAB) 10 MG (21) TBPK tablet Take by mouth daily. Take 6 tabs by mouth daily  for 2 days, then 5 tabs for 2 days, then 4 tabs for 2 days, then 3 tabs for 2 days, 2 tabs for 2 days, then 1 tab by mouth daily for 2 days 03/11/21   Liberty Handy, PA-C  trimethoprim-polymyxin b (POLYTRIM) ophthalmic solution Place 1 drop into the left eye every 4 (four) hours. 08/05/18   Raeford Razor, MD      Allergies    Patient has no known allergies.    Review of Systems   Review of Systems 10 systems reviewed negative except as per HPI Physical Exam Updated Vital Signs BP (!) 91/53 (BP Location: Right Arm)   Pulse 77   Temp 98.9 F (37.2 C) (Oral)   Resp 16   Ht 5\' 8"  (1.727 m)   Wt 83.9 kg   LMP 05/20/2022 (Approximate)   SpO2 100%   BMI 28.13 kg/m  Physical Exam Constitutional:      Comments: Patient is alert nontoxic.  Mental status clear.  Well-nourished well-developed.  HENT:     Head: Normocephalic and atraumatic.     Comments:  No facial swelling.  No palpable abnormalities around the temples or ears    Ears:     Comments: Bilateral TMs normal.  Patient does have piercing in the tragus on the right.  This does not appear to be infected or swollen.  Patient does not specifically have any pain with movement of the tragus or patient around it.    Nose: Nose normal.     Mouth/Throat:     Mouth: Mucous membranes are moist.     Pharynx: Oropharynx is clear.     Comments: Dentition in excellent condition.  Posterior airway widely patent.  No tonsillar erythema exudate or swelling. Eyes:     Extraocular Movements: Extraocular movements intact.     Conjunctiva/sclera: Conjunctivae normal.     Pupils: Pupils are equal, round, and reactive to light.  Neck:      Comments: Neck is supple.  No meningismus.  No cervical lymphadenopathy. Cardiovascular:     Rate and Rhythm: Normal rate and regular rhythm.  Pulmonary:     Effort: Pulmonary effort is normal.     Breath sounds: Normal breath sounds.  Abdominal:     General: There is no distension.     Palpations: Abdomen is soft.     Tenderness: There is no abdominal tenderness. There is no guarding.  Musculoskeletal:        General: No swelling or tenderness. Normal range of motion.     Cervical back: Normal range of motion and neck supple.  Skin:    General: Skin is warm and dry.  Neurological:     General: No focal deficit present.     Mental Status: She is oriented to person, place, and time.     Motor: No weakness.     Coordination: Coordination normal.     Comments: Normal finger-nose exam bilaterally.  Normal cranial nerves II through XII.  Normal strength testing upper extremities.  Normal lower extremity strength.  Psychiatric:        Mood and Affect: Mood normal.     ED Results / Procedures / Treatments   Labs (all labs ordered are listed, but only abnormal results are displayed) Labs Reviewed  BASIC METABOLIC PANEL  CBC WITH DIFFERENTIAL/PLATELET  HCG, SERUM, QUALITATIVE  SEDIMENTATION RATE    EKG None  Radiology CT ANGIO HEAD NECK W WO CM  Result Date: 06/19/2022 CLINICAL DATA:  Provided history: Headache, sudden, severe. EXAM: CT ANGIOGRAPHY HEAD AND NECK TECHNIQUE: Multidetector CT imaging of the head and neck was performed using the standard protocol during bolus administration of intravenous contrast. Multiplanar CT image reconstructions and MIPs were obtained to evaluate the vascular anatomy. Carotid stenosis measurements (when applicable) are obtained utilizing NASCET criteria, using the distal internal carotid diameter as the denominator. RADIATION DOSE REDUCTION: This exam was performed according to the departmental dose-optimization program which includes  automated exposure control, adjustment of the mA and/or kV according to patient size and/or use of iterative reconstruction technique. CONTRAST:  5mL OMNIPAQUE IOHEXOL 350 MG/ML SOLN COMPARISON:  Head CT 02/15/2021. FINDINGS: CT HEAD FINDINGS Brain: Cerebral volume is normal. There is no acute intracranial hemorrhage. No demarcated cortical infarct. No extra-axial fluid collection. No evidence of an intracranial mass. No midline shift. Vascular: No hyperdense vessel. Skull: No fracture or aggressive osseous lesion. Sinuses/Orbits: No orbital mass or acute orbital finding. No significant paranasal sinus disease. Review of the MIP images confirms the above findings CTA NECK FINDINGS Aortic arch: Standard aortic branching. The visualized aortic arch is normal  in caliber. Streak and beam hardening artifact arising from a dense left-sided contrast bolus partially obscures the left subclavian artery and innominate artery. Within this limitation, there is no appreciable hemodynamically significant innominate or proximal subclavian artery stenosis. Right carotid system: CCA and ICA patent within the neck without stenosis. No evidence of dissection. Left carotid system: Streak and beam hardening artifact arising from a dense left-sided contrast bolus partially obscures the proximal CCA. Within this limitation, the CCA and ICA are patent within the neck without appreciable stenosis. No evidence of dissection. Vertebral arteries: Vertebral arteries codominant and patent within the neck without stenosis. No evidence of dissection. Skeleton: Straightening of the expected cervical lordosis. No acute fracture or aggressive osseous lesion. Other neck: No neck mass or cervical lymphadenopathy. Upper chest: Cluster of small solid and ground-glass pulmonary nodules within the partially imaged right upper lobe (measuring up to 5 mm), likely infectious/inflammatory. Review of the MIP images confirms the above findings CTA HEAD FINDINGS  Anterior circulation: The intracranial internal carotid arteries are patent. The M1 middle cerebral arteries are patent. No M2 proximal branch occlusion or high-grade proximal stenosis. The anterior cerebral arteries are patent. No intracranial aneurysm is identified. Posterior circulation: The intracranial vertebral arteries are patent. The basilar artery is patent. The posterior cerebral arteries are patent. Posterior communicating arteries are diminutive or absent, bilaterally. Venous sinuses: Within the limitations of contrast timing, no convincing thrombus. Anatomic variants: As described. Review of the MIP images confirms the above findings IMPRESSION: CT head: Unremarkable non-contrast CT appearance of the brain. No evidence of acute intracranial abnormality. CTA neck: 1. Streak and beam hardening artifact arising from a dense left-sided contrast bolus partially obscures the proximal left common carotid artery. Within this limitation, the common carotid, internal carotid and vertebral arteries are patent within the neck without appreciable stenosis or evidence of dissection. 2. Cluster of small solid and ground-glass pulmonary nodules within the partially imaged right upper lobe (measuring up to 5 mm), likely infectious/inflammatory. CTA head: 1. No intracranial large vessel occlusion or proximal high-grade arterial stenosis. 2. No intracranial aneurysm is identified. Electronically Signed   By: Jackey Loge D.O.   On: 06/19/2022 14:33    Procedures Procedures    Medications Ordered in ED Medications  iohexol (OMNIPAQUE) 350 MG/ML injection 75 mL (75 mLs Intravenous Contrast Given 06/19/22 1352)    ED Course/ Medical Decision Making/ A&P                           Medical Decision Making Amount and/or Complexity of Data Reviewed Labs: ordered. Radiology: ordered.  Risk Prescription drug management.   Patient presents as outlined.  She had acute onset of temporal headache on the right.   This then abated but then recurred later in the week.  Patient did not have any visual changes in association with this.  She did however have perceived numbness or paresthesia of the right arm and leg upon awakening yesterday.  At this time we will proceed with diagnostic imaging and labs.  Will obtain CT angiogram to rule out aneurysm or vertebrobasilar dissection.  Will obtain sed rate.  Temporal arteritis seems of lower probability.  Patient is young and there is no significant reproducible pain along the temple.  Differential diagnosis also includes trigeminal neuralgia.  Patient does describe pains that are fairly sharp and uncomfortable in front of her ear and towards her temple and also sometimes the jaw.  At this time there  is no reproducible pain.  With patient clinically well in appearance no suspicion for infectious etiology and workup thus far negative, plan will be for anti-inflammatories for a week to determine response to treatment.  Naproxen prescribed.  Patient has follow-up scheduled with PCP in September.  Have also provided contact information for ENT.  Return precautions reviewed.  This point patient is stable for discharge and continued outpatient management.        Final Clinical Impression(s) / ED Diagnoses Final diagnoses:  Bad headache    Rx / DC Orders ED Discharge Orders          Ordered    naproxen (NAPROSYN) 500 MG tablet  2 times daily        06/19/22 1513              Arby Barrette, MD 06/19/22 1541

## 2022-06-19 NOTE — ED Triage Notes (Signed)
C/o headache and right arm and leg tingling last week. States symptoms resolved but headache came back last night.

## 2022-08-26 IMAGING — US US PELVIS COMPLETE TRANSABD/TRANSVAG W DUPLEX AND/OR DOPPLER
1 series · 13 of 25 positions shown · non-contrast
Comparison: None.
COMPARISON: None.

Addendum:
CLINICAL DATA: Pelvic pain

EXAM:
TRANSABDOMINAL AND TRANSVAGINAL ULTRASOUND OF PELVIS
DOPPLER ULTRASOUND OF OVARIES
TECHNIQUE: Both transabdominal and transvaginal ultrasound examinations of the
pelvis were performed. Transabdominal technique was performed for
global imaging of the pelvis including uterus, ovaries, adnexal
regions, and pelvic cul-de-sac.
It was necessary to proceed with endovaginal exam following the
transabdominal exam to visualize the endometrium and ovaries. Color
and duplex Doppler ultrasound was utilized to evaluate blood flow to
the ovaries.

[Series 1: us pelvis complete transabd/transvag w duplex and/ · 13 of 107 slices shown]
[im 1/107]
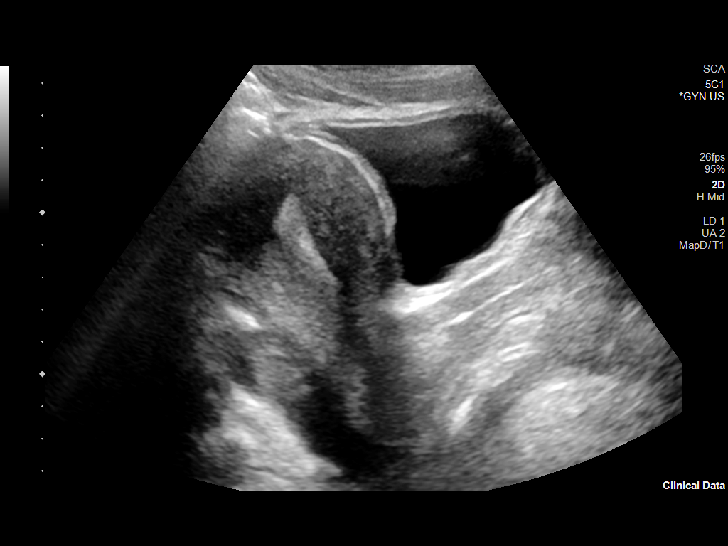
[im 9/107]
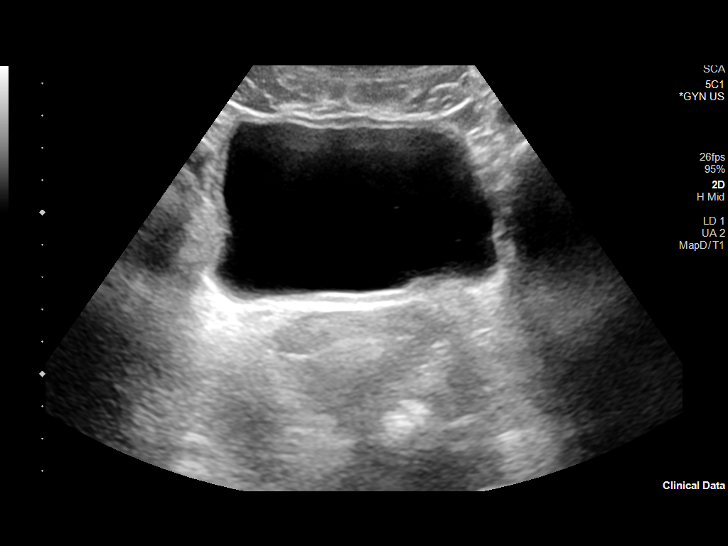
[im 18/107]
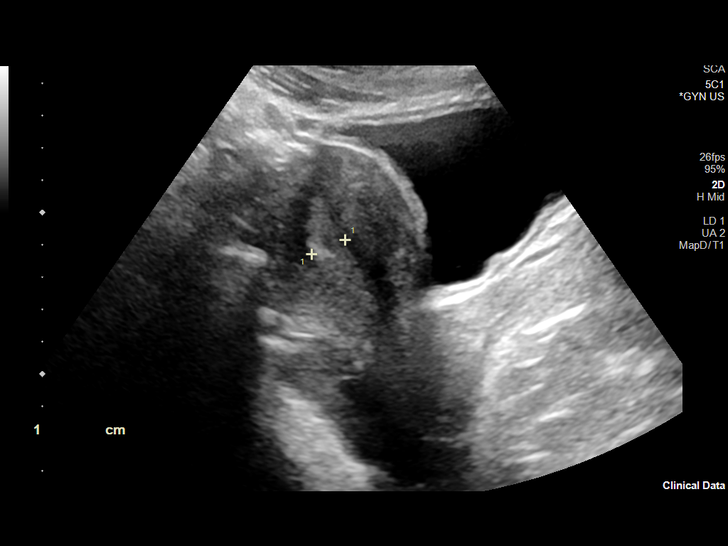
[im 27/107]
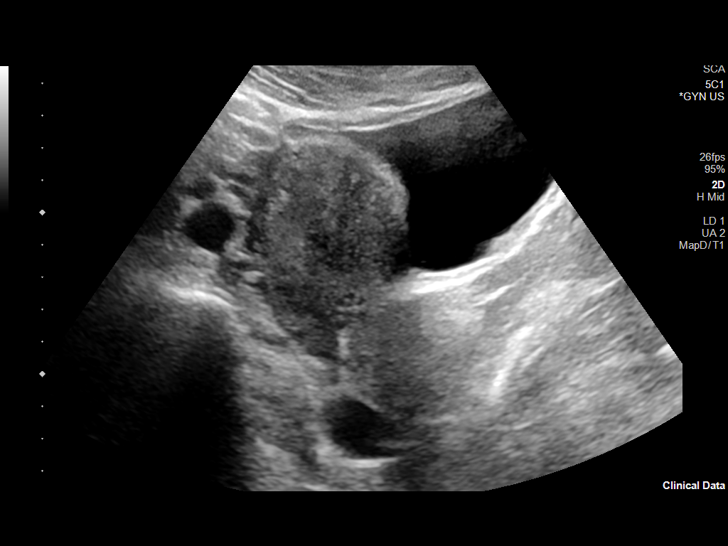
[im 36/107]
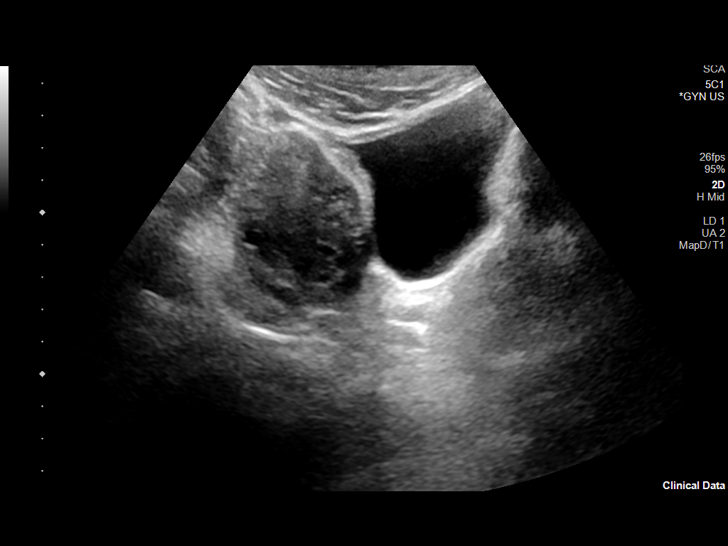
[im 45/107]
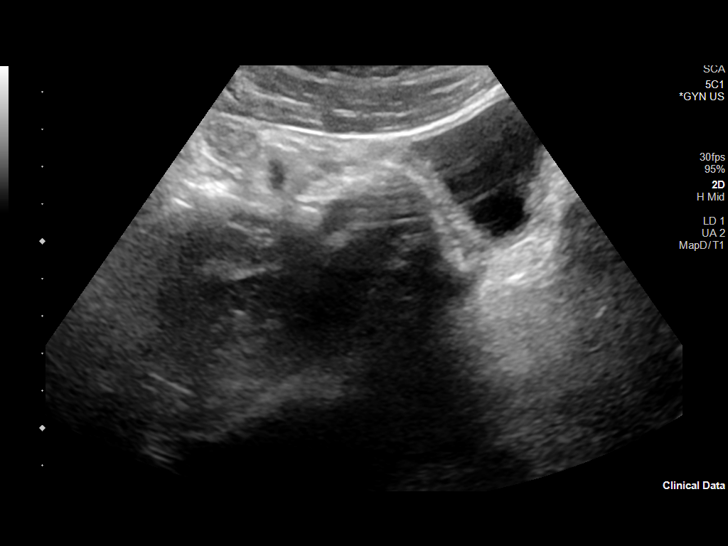
[im 54/107]
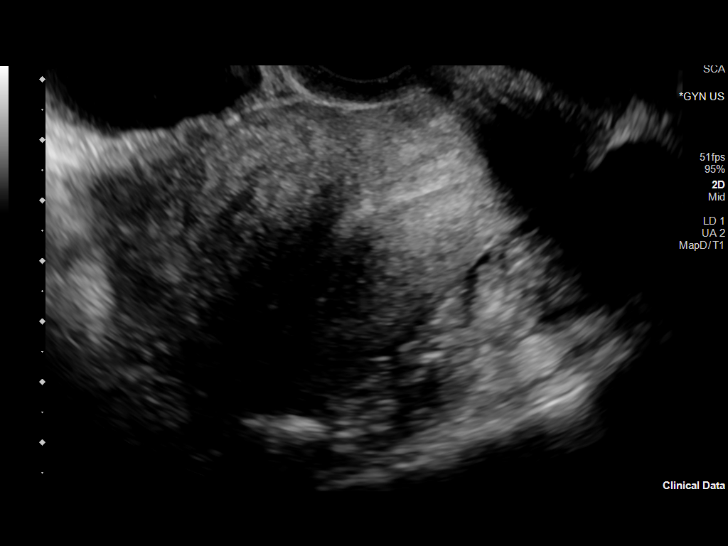
[im 62/107]
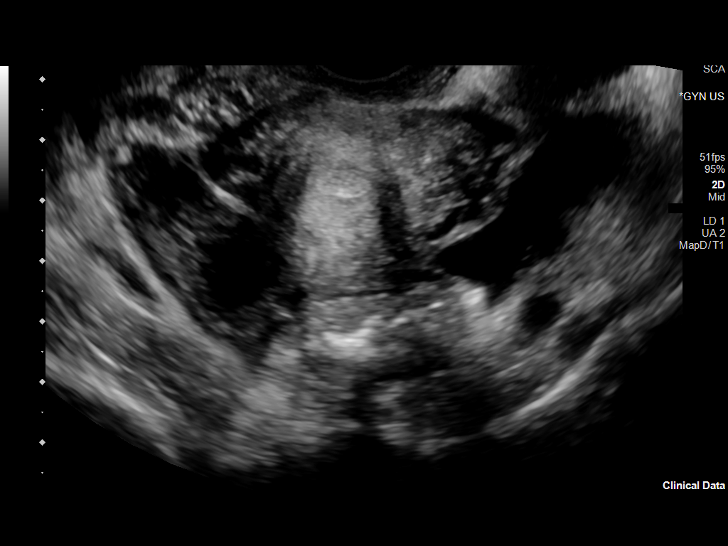
[im 71/107]
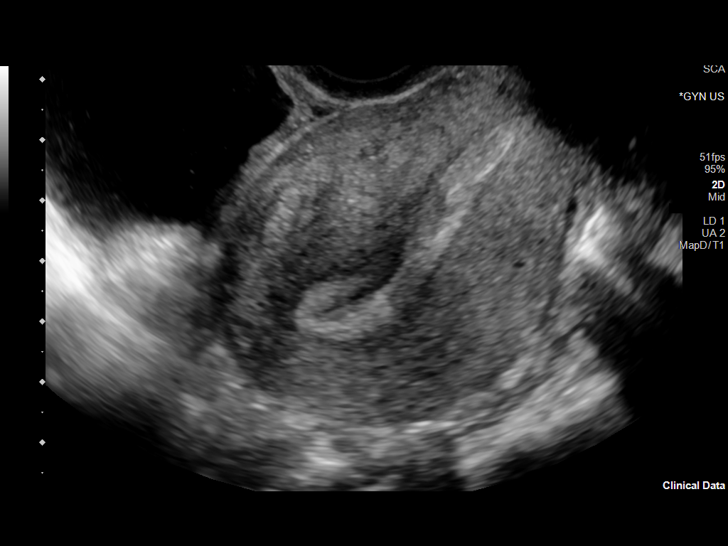
[im 80/107]
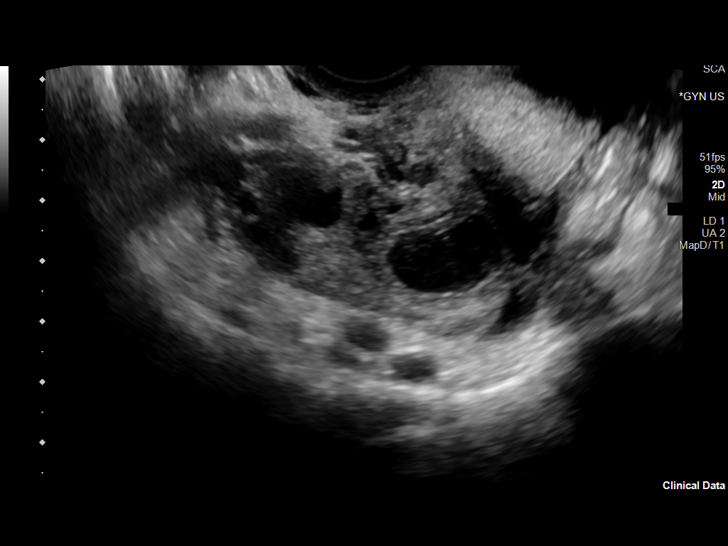
[im 89/107]
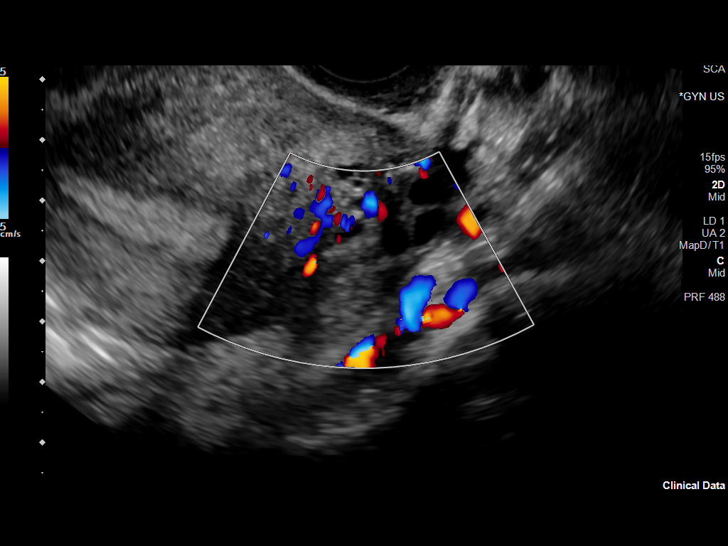
[im 98/107]
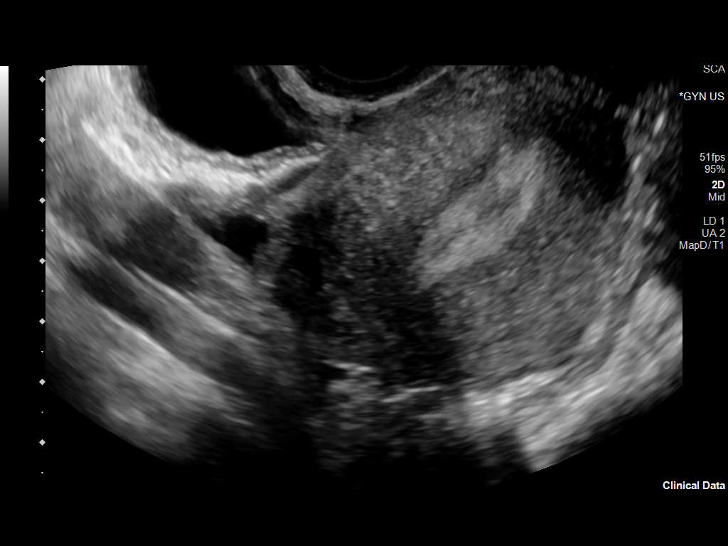
[im 107/107]
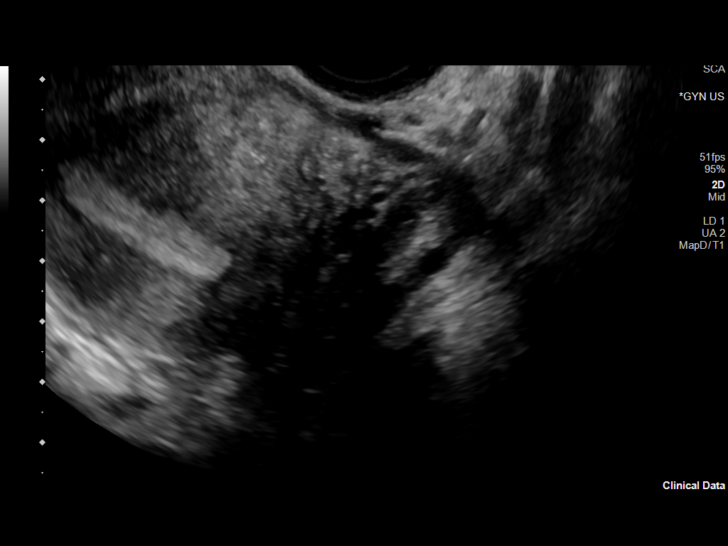

[13 of 25 positions shown; findings below may reference images not displayed]

FINDINGS: Uterus

Measurements: 9.4 x 4.8 x 5.2 cm = volume: 122.1 mL. There is
slightly inhomogeneous echogenicity without discrete nodules in
myometrium.

Endometrium

Thickness: 11 mm. No significant focal abnormality visualized.
Possible trace amount of fluid is seen in the endometrial cavity.

Right ovary

Measurements: 4.9 x 1.9 by 2.1 cm = volume: 10.2 mL. Small follicles
are seen.

Left ovary

Measurements: 5.2 x 1.3 x 1.4 cm = volume: 4.9 mL. There are
subcentimeter follicles.

Pulsed Doppler evaluation of both ovaries demonstrates normal
low-resistance arterial and venous waveforms.

Other findings

Small amount of free fluid is seen in the pelvis.
IMPRESSION: Small amount of free fluid in the pelvis may be due to recent
rupture of ovarian cyst or follicle. Pelvic sonogram is otherwise
unremarkable.

ADDENDUM:
This addendum is made to clarify the Doppler technique used for the
study. Color flow Doppler and pulsed Doppler examinations were
performed.

*** End of Addendum ***
FINDINGS: Uterus

Measurements: 9.4 x 4.8 x 5.2 cm = volume: 122.1 mL. There is
slightly inhomogeneous echogenicity without discrete nodules in
myometrium.

Endometrium

Thickness: 11 mm. No significant focal abnormality visualized.
Possible trace amount of fluid is seen in the endometrial cavity.

Right ovary

Measurements: 4.9 x 1.9 by 2.1 cm = volume: 10.2 mL. Small follicles
are seen.

Left ovary

Measurements: 5.2 x 1.3 x 1.4 cm = volume: 4.9 mL. There are
subcentimeter follicles.

Pulsed Doppler evaluation of both ovaries demonstrates normal
low-resistance arterial and venous waveforms.

Other findings

Small amount of free fluid is seen in the pelvis.
IMPRESSION: Small amount of free fluid in the pelvis may be due to recent
rupture of ovarian cyst or follicle. Pelvic sonogram is otherwise
unremarkable.
